# Patient Record
Sex: Female | Born: 1970 | Race: Black or African American | Hispanic: No | Marital: Married | Smoking: Former smoker
Health system: Southern US, Community
[De-identification: ages and names within clinical notes are randomized; demographics above are authoritative.]

## PROBLEM LIST (undated history)

## (undated) DIAGNOSIS — D259 Leiomyoma of uterus, unspecified: Secondary | ICD-10-CM

## (undated) DIAGNOSIS — R51 Headache: Secondary | ICD-10-CM

## (undated) DIAGNOSIS — E860 Dehydration: Secondary | ICD-10-CM

## (undated) DIAGNOSIS — R300 Dysuria: Secondary | ICD-10-CM

## (undated) DIAGNOSIS — I10 Essential (primary) hypertension: Secondary | ICD-10-CM

## (undated) DIAGNOSIS — R030 Elevated blood-pressure reading, without diagnosis of hypertension: Secondary | ICD-10-CM

## (undated) DIAGNOSIS — Z973 Presence of spectacles and contact lenses: Secondary | ICD-10-CM

## (undated) DIAGNOSIS — F411 Generalized anxiety disorder: Secondary | ICD-10-CM

## (undated) DIAGNOSIS — K219 Gastro-esophageal reflux disease without esophagitis: Secondary | ICD-10-CM

## (undated) DIAGNOSIS — A6000 Herpesviral infection of urogenital system, unspecified: Secondary | ICD-10-CM

## (undated) DIAGNOSIS — R002 Palpitations: Secondary | ICD-10-CM

## (undated) DIAGNOSIS — Z972 Presence of dental prosthetic device (complete) (partial): Secondary | ICD-10-CM

## (undated) DIAGNOSIS — K5900 Constipation, unspecified: Secondary | ICD-10-CM

## (undated) DIAGNOSIS — K589 Irritable bowel syndrome without diarrhea: Secondary | ICD-10-CM

## (undated) HISTORY — DX: Headache: R51

## (undated) HISTORY — DX: Dehydration: E86.0

## (undated) HISTORY — DX: Dysuria: R30.0

## (undated) HISTORY — DX: Herpesviral infection of urogenital system, unspecified: A60.00

## (undated) HISTORY — DX: Palpitations: R00.2

## (undated) HISTORY — PX: BREAST LUMPECTOMY: SHX2

## (undated) HISTORY — DX: Irritable bowel syndrome, unspecified: K58.9

## (undated) HISTORY — DX: Gastro-esophageal reflux disease without esophagitis: K21.9

## (undated) HISTORY — DX: Generalized anxiety disorder: F41.1

## (undated) HISTORY — DX: Elevated blood-pressure reading, without diagnosis of hypertension: R03.0

---

## 2000-01-11 ENCOUNTER — Encounter: Payer: Self-pay | Admitting: Family Medicine

## 2000-01-11 ENCOUNTER — Encounter: Admission: RE | Admit: 2000-01-11 | Discharge: 2000-01-11 | Payer: Self-pay | Admitting: Family Medicine

## 2000-08-01 ENCOUNTER — Other Ambulatory Visit: Admission: RE | Admit: 2000-08-01 | Discharge: 2000-08-01 | Payer: Self-pay | Admitting: Obstetrics & Gynecology

## 2001-05-16 HISTORY — PX: TUBAL LIGATION: SHX77

## 2001-06-30 ENCOUNTER — Inpatient Hospital Stay (HOSPITAL_COMMUNITY): Admission: AD | Admit: 2001-06-30 | Discharge: 2001-06-30 | Payer: Self-pay | Admitting: Obstetrics & Gynecology

## 2001-07-28 ENCOUNTER — Inpatient Hospital Stay (HOSPITAL_COMMUNITY): Admission: AD | Admit: 2001-07-28 | Discharge: 2001-07-31 | Payer: Self-pay | Admitting: Obstetrics and Gynecology

## 2001-09-07 ENCOUNTER — Ambulatory Visit (HOSPITAL_COMMUNITY): Admission: RE | Admit: 2001-09-07 | Discharge: 2001-09-07 | Payer: Self-pay | Admitting: Obstetrics and Gynecology

## 2002-05-30 ENCOUNTER — Other Ambulatory Visit: Admission: RE | Admit: 2002-05-30 | Discharge: 2002-05-30 | Payer: Self-pay | Admitting: Obstetrics and Gynecology

## 2002-05-31 ENCOUNTER — Other Ambulatory Visit: Admission: RE | Admit: 2002-05-31 | Discharge: 2002-05-31 | Payer: Self-pay | Admitting: Obstetrics and Gynecology

## 2003-07-07 ENCOUNTER — Other Ambulatory Visit: Admission: RE | Admit: 2003-07-07 | Discharge: 2003-07-07 | Payer: Self-pay | Admitting: Obstetrics & Gynecology

## 2004-07-28 ENCOUNTER — Other Ambulatory Visit: Admission: RE | Admit: 2004-07-28 | Discharge: 2004-07-28 | Payer: Self-pay | Admitting: Obstetrics and Gynecology

## 2005-01-11 ENCOUNTER — Ambulatory Visit: Payer: Self-pay | Admitting: Gastroenterology

## 2005-02-04 ENCOUNTER — Ambulatory Visit: Payer: Self-pay | Admitting: Gastroenterology

## 2005-02-14 ENCOUNTER — Ambulatory Visit: Payer: Self-pay | Admitting: Gastroenterology

## 2007-07-03 ENCOUNTER — Emergency Department (HOSPITAL_COMMUNITY): Admission: EM | Admit: 2007-07-03 | Discharge: 2007-07-03 | Payer: Self-pay | Admitting: Family Medicine

## 2008-05-28 DIAGNOSIS — K5900 Constipation, unspecified: Secondary | ICD-10-CM

## 2008-05-28 DIAGNOSIS — Z862 Personal history of diseases of the blood and blood-forming organs and certain disorders involving the immune mechanism: Secondary | ICD-10-CM

## 2008-05-30 ENCOUNTER — Ambulatory Visit: Payer: Self-pay | Admitting: Gastroenterology

## 2008-05-30 DIAGNOSIS — K589 Irritable bowel syndrome without diarrhea: Secondary | ICD-10-CM

## 2008-05-30 LAB — CONVERTED CEMR LAB
ALT: 11 units/L (ref 0–35)
AST: 16 units/L (ref 0–37)
Albumin: 3.7 g/dL (ref 3.5–5.2)
Alkaline Phosphatase: 63 units/L (ref 39–117)
BUN: 11 mg/dL (ref 6–23)
Basophils Absolute: 0 10*3/uL (ref 0.0–0.1)
Basophils Relative: 0.8 % (ref 0.0–3.0)
CO2: 25 meq/L (ref 19–32)
Calcium: 9 mg/dL (ref 8.4–10.5)
Chloride: 108 meq/L (ref 96–112)
Creatinine, Ser: 0.6 mg/dL (ref 0.4–1.2)
Eosinophils Absolute: 0.1 10*3/uL (ref 0.0–0.7)
Eosinophils Relative: 1.9 % (ref 0.0–5.0)
GFR calc Af Amer: 146 mL/min
GFR calc non Af Amer: 121 mL/min
Glucose, Bld: 85 mg/dL (ref 70–99)
HCT: 32.9 % — ABNORMAL LOW (ref 36.0–46.0)
Hemoglobin: 11.1 g/dL — ABNORMAL LOW (ref 12.0–15.0)
Lymphocytes Relative: 29.8 % (ref 12.0–46.0)
MCHC: 33.7 g/dL (ref 30.0–36.0)
MCV: 86.6 fL (ref 78.0–100.0)
Monocytes Absolute: 0.4 10*3/uL (ref 0.1–1.0)
Monocytes Relative: 7.4 % (ref 3.0–12.0)
Neutro Abs: 3.3 10*3/uL (ref 1.4–7.7)
Neutrophils Relative %: 60.1 % (ref 43.0–77.0)
Platelets: 232 10*3/uL (ref 150–400)
Potassium: 4.2 meq/L (ref 3.5–5.1)
RBC: 3.81 M/uL — ABNORMAL LOW (ref 3.87–5.11)
RDW: 13 % (ref 11.5–14.6)
Sed Rate: 29 mm/hr — ABNORMAL HIGH (ref 0–22)
Sodium: 138 meq/L (ref 135–145)
TSH: 1.69 microintl units/mL (ref 0.35–5.50)
Total Bilirubin: 0.6 mg/dL (ref 0.3–1.2)
Total Protein: 6.9 g/dL (ref 6.0–8.3)
WBC: 5.4 10*3/uL (ref 4.5–10.5)

## 2008-06-27 ENCOUNTER — Ambulatory Visit: Payer: Self-pay | Admitting: Gastroenterology

## 2008-07-01 ENCOUNTER — Ambulatory Visit (HOSPITAL_COMMUNITY): Admission: RE | Admit: 2008-07-01 | Discharge: 2008-07-01 | Payer: Self-pay | Admitting: Gastroenterology

## 2008-09-23 ENCOUNTER — Telehealth: Payer: Self-pay | Admitting: Gastroenterology

## 2008-11-15 ENCOUNTER — Emergency Department (HOSPITAL_COMMUNITY): Admission: EM | Admit: 2008-11-15 | Discharge: 2008-11-15 | Payer: Self-pay | Admitting: Family Medicine

## 2009-02-23 LAB — CONVERTED CEMR LAB

## 2009-05-16 HISTORY — PX: COLONOSCOPY WITH ESOPHAGOGASTRODUODENOSCOPY (EGD): SHX5779

## 2009-06-30 ENCOUNTER — Encounter (INDEPENDENT_AMBULATORY_CARE_PROVIDER_SITE_OTHER): Payer: Self-pay | Admitting: *Deleted

## 2009-06-30 ENCOUNTER — Ambulatory Visit: Payer: Self-pay | Admitting: Gastroenterology

## 2009-06-30 DIAGNOSIS — K5909 Other constipation: Secondary | ICD-10-CM

## 2009-06-30 LAB — CONVERTED CEMR LAB: Tissue Transglutaminase Ab, IgA: 0.3 units (ref <7)

## 2009-07-01 LAB — CONVERTED CEMR LAB
ALT: 11 units/L (ref 0–35)
AST: 14 units/L (ref 0–37)
Albumin: 3.9 g/dL (ref 3.5–5.2)
Alkaline Phosphatase: 63 units/L (ref 39–117)
BUN: 13 mg/dL (ref 6–23)
Basophils Absolute: 0 10*3/uL (ref 0.0–0.1)
Basophils Relative: 0.6 % (ref 0.0–3.0)
Bilirubin, Direct: 0.1 mg/dL (ref 0.0–0.3)
CO2: 25 meq/L (ref 19–32)
Calcium: 8.7 mg/dL (ref 8.4–10.5)
Chloride: 106 meq/L (ref 96–112)
Creatinine, Ser: 0.6 mg/dL (ref 0.4–1.2)
Eosinophils Absolute: 0.1 10*3/uL (ref 0.0–0.7)
Eosinophils Relative: 1.3 % (ref 0.0–5.0)
Ferritin: 3.3 ng/mL — ABNORMAL LOW (ref 10.0–291.0)
Folate: 8.4 ng/mL
GFR calc non Af Amer: 144.7 mL/min (ref >60)
Glucose, Bld: 95 mg/dL (ref 70–99)
HCT: 31.2 % — ABNORMAL LOW (ref 36.0–46.0)
Hemoglobin: 10.2 g/dL — ABNORMAL LOW (ref 12.0–15.0)
IgA: 276 mg/dL (ref 68–378)
Iron: 38 ug/dL — ABNORMAL LOW (ref 42–145)
Lymphocytes Relative: 34.6 % (ref 12.0–46.0)
Lymphs Abs: 1.7 10*3/uL (ref 0.7–4.0)
MCHC: 32.8 g/dL (ref 30.0–36.0)
MCV: 84.1 fL (ref 78.0–100.0)
Monocytes Absolute: 0.4 10*3/uL (ref 0.1–1.0)
Monocytes Relative: 7.7 % (ref 3.0–12.0)
Neutro Abs: 2.6 10*3/uL (ref 1.4–7.7)
Neutrophils Relative %: 55.8 % (ref 43.0–77.0)
Platelets: 282 10*3/uL (ref 150.0–400.0)
Potassium: 3.7 meq/L (ref 3.5–5.1)
RBC: 3.7 M/uL — ABNORMAL LOW (ref 3.87–5.11)
RDW: 14.7 % — ABNORMAL HIGH (ref 11.5–14.6)
Saturation Ratios: 6.7 % — ABNORMAL LOW (ref 20.0–50.0)
Sed Rate: 33 mm/hr — ABNORMAL HIGH (ref 0–22)
Sodium: 138 meq/L (ref 135–145)
TSH: 1.38 microintl units/mL (ref 0.35–5.50)
Total Bilirubin: 0.2 mg/dL — ABNORMAL LOW (ref 0.3–1.2)
Total Protein: 6.9 g/dL (ref 6.0–8.3)
Transferrin: 402.2 mg/dL — ABNORMAL HIGH (ref 212.0–360.0)
Vitamin B-12: 386 pg/mL (ref 211–911)
WBC: 4.8 10*3/uL (ref 4.5–10.5)

## 2009-07-06 ENCOUNTER — Ambulatory Visit: Payer: Self-pay | Admitting: Gastroenterology

## 2009-07-08 ENCOUNTER — Encounter: Payer: Self-pay | Admitting: Gastroenterology

## 2010-01-16 ENCOUNTER — Encounter: Payer: Self-pay | Admitting: Internal Medicine

## 2010-01-16 LAB — CONVERTED CEMR LAB: Hgb A1c MFr Bld: 5.8 %

## 2010-02-04 ENCOUNTER — Ambulatory Visit: Payer: Self-pay | Admitting: Internal Medicine

## 2010-02-04 DIAGNOSIS — Z87891 Personal history of nicotine dependence: Secondary | ICD-10-CM

## 2010-02-04 DIAGNOSIS — R51 Headache: Secondary | ICD-10-CM | POA: Insufficient documentation

## 2010-02-04 DIAGNOSIS — Z9189 Other specified personal risk factors, not elsewhere classified: Secondary | ICD-10-CM | POA: Insufficient documentation

## 2010-02-04 DIAGNOSIS — R358 Other polyuria: Secondary | ICD-10-CM

## 2010-02-04 DIAGNOSIS — R519 Headache, unspecified: Secondary | ICD-10-CM | POA: Insufficient documentation

## 2010-02-04 DIAGNOSIS — A6000 Herpesviral infection of urogenital system, unspecified: Secondary | ICD-10-CM | POA: Insufficient documentation

## 2010-02-05 LAB — CONVERTED CEMR LAB
ALT: 10 units/L (ref 0–35)
AST: 17 units/L (ref 0–37)
Albumin: 3.5 g/dL (ref 3.5–5.2)
Alkaline Phosphatase: 57 units/L (ref 39–117)
BUN: 10 mg/dL (ref 6–23)
Basophils Absolute: 0 10*3/uL (ref 0.0–0.1)
Basophils Relative: 0.8 % (ref 0.0–3.0)
Bilirubin Urine: NEGATIVE
Bilirubin, Direct: 0.1 mg/dL (ref 0.0–0.3)
CO2: 26 meq/L (ref 19–32)
Calcium: 8.8 mg/dL (ref 8.4–10.5)
Chloride: 106 meq/L (ref 96–112)
Cholesterol: 197 mg/dL (ref 0–200)
Creatinine, Ser: 0.7 mg/dL (ref 0.4–1.2)
Eosinophils Absolute: 0.2 10*3/uL (ref 0.0–0.7)
Eosinophils Relative: 4.2 % (ref 0.0–5.0)
GFR calc non Af Amer: 126.98 mL/min (ref >60)
Glucose, Bld: 79 mg/dL (ref 70–99)
HCT: 32.1 % — ABNORMAL LOW (ref 36.0–46.0)
HDL: 78.9 mg/dL (ref >39.00)
Hemoglobin, Urine: NEGATIVE
Hemoglobin: 11.1 g/dL — ABNORMAL LOW (ref 12.0–15.0)
Ketones, ur: NEGATIVE mg/dL
LDL Cholesterol: 109 mg/dL — ABNORMAL HIGH (ref 0–99)
Leukocytes, UA: NEGATIVE
Lymphocytes Relative: 36.6 % (ref 12.0–46.0)
Lymphs Abs: 1.5 10*3/uL (ref 0.7–4.0)
MCHC: 34.6 g/dL (ref 30.0–36.0)
MCV: 85.2 fL (ref 78.0–100.0)
Monocytes Absolute: 0.4 10*3/uL (ref 0.1–1.0)
Monocytes Relative: 9.1 % (ref 3.0–12.0)
Neutro Abs: 2.1 10*3/uL (ref 1.4–7.7)
Neutrophils Relative %: 49.3 % (ref 43.0–77.0)
Nitrite: NEGATIVE
Platelets: 253 10*3/uL (ref 150.0–400.0)
Potassium: 5.3 meq/L — ABNORMAL HIGH (ref 3.5–5.1)
RBC: 3.77 M/uL — ABNORMAL LOW (ref 3.87–5.11)
RDW: 16.1 % — ABNORMAL HIGH (ref 11.5–14.6)
Sodium: 140 meq/L (ref 135–145)
Specific Gravity, Urine: >=1.03 (ref 1.000–1.030)
TSH: 0.76 microintl units/mL (ref 0.35–5.50)
Total Bilirubin: 0.3 mg/dL (ref 0.3–1.2)
Total CHOL/HDL Ratio: 2
Total Protein, Urine: NEGATIVE mg/dL
Total Protein: 6.6 g/dL (ref 6.0–8.3)
Triglycerides: 44 mg/dL (ref 0.0–149.0)
Urine Glucose: NEGATIVE mg/dL
Urobilinogen, UA: 0.2 (ref 0.0–1.0)
VLDL: 8.8 mg/dL (ref 0.0–40.0)
WBC: 4.2 10*3/uL — ABNORMAL LOW (ref 4.5–10.5)
pH: 6 (ref 5.0–8.0)

## 2010-03-05 ENCOUNTER — Telehealth: Payer: Self-pay | Admitting: Internal Medicine

## 2010-03-16 ENCOUNTER — Telehealth: Payer: Self-pay | Admitting: Internal Medicine

## 2010-03-18 ENCOUNTER — Ambulatory Visit: Payer: Self-pay | Admitting: Internal Medicine

## 2010-03-18 DIAGNOSIS — B373 Candidiasis of vulva and vagina: Secondary | ICD-10-CM

## 2010-03-18 DIAGNOSIS — R300 Dysuria: Secondary | ICD-10-CM | POA: Insufficient documentation

## 2010-03-18 LAB — CONVERTED CEMR LAB
Nitrite: NEGATIVE
Total Protein, Urine: NEGATIVE mg/dL
pH: 6 (ref 5.0–8.0)

## 2010-04-16 ENCOUNTER — Encounter: Payer: Self-pay | Admitting: Internal Medicine

## 2010-04-30 ENCOUNTER — Telehealth: Payer: Self-pay | Admitting: Internal Medicine

## 2010-04-30 ENCOUNTER — Ambulatory Visit: Payer: Self-pay | Admitting: Internal Medicine

## 2010-06-17 NOTE — Assessment & Plan Note (Signed)
Summary: NEW [employer] PT--#--PKG--STC   Vital Signs:  Patient profile:   40 year old female Height:      67 inches (170.18 cm) Weight:      138.0 pounds (62.73 kg) O2 Sat:      99 % on Room air Temp:     98.5 degrees F (36.94 degrees C) oral Pulse rate:   88 / minute BP sitting:   98 / 76  (left arm) Cuff size:   regular  Vitals Entered By: Orlan Leavens RMA (February 04, 2010 8:24 AM)  O2 Flow:  Room air CC: New patient Is Patient Diabetic? No Pain Assessment Patient in pain? no        Primary Care Provider:  Newt Lukes MD  CC:  New patient.  History of Present Illness: new pt to me and our division, here to est care also, patient is here today for annual physical. Patient feels generally well today.   reviewed other issues:  1) preDM - recent labs done 01/14/10 suggest inc risk for DM (a1c 5.8) - advised to watch carbs in diet  2) constipation, chronic, has been eval by GI for same early 2011  3) c/o polyuria - a/w mild dysuria - onset 6 weeks ago -course is intermittent - no hematuria or fever - improved s/p 5 d septra course but now symptoms relapsed  Preventive Screening-Counseling & Management  Alcohol-Tobacco     Alcohol drinks/day: <1     Alcohol Counseling: not indicated; use of alcohol is not excessive or problematic     Smoking Status: current     Packs/Day: <0.25     Tobacco Counseling: to quit use of tobacco products  Caffeine-Diet-Exercise     Does Patient Exercise: no     Exercise Counseling: to improve exercise regimen     Depression Counseling: not indicated; screening negative for depression  Safety-Violence-Falls     Seat Belt Counseling: not indicated; patient wears seat belts     Helmet Counseling: not applicable     Firearm Counseling: not applicable     Violence Counseling: not indicated; no violence risk noted     Fall Risk Counseling: not indicated; no significant falls noted  Clinical Review Panels:  Prevention   Last Pap  Smear:  Interpretation Result:Negative for intraepithelial Lesion or Malignancy.    (02/23/2009)   Last Colonoscopy:  DONE (07/06/2009)  Diabetes Management   HgBA1C:  5.8 (01/16/2010)   Creatinine:  0.6 (06/30/2009)  CBC   WBC:  4.8 (06/30/2009)   RBC:  3.70 (06/30/2009)   Hgb:  10.2 (06/30/2009)   Hct:  31.2 (06/30/2009)   Platelets:  282.0 (06/30/2009)   MCV  84.1 (06/30/2009)   MCHC  32.8 (06/30/2009)   RDW  14.7 (06/30/2009)   PMN:  55.8 (06/30/2009)   Lymphs:  34.6 (06/30/2009)   Monos:  7.7 (06/30/2009)   Eosinophils:  1.3 (06/30/2009)   Basophil:  0.6 (06/30/2009)  Complete Metabolic Panel   Glucose:  95 (06/30/2009)   Sodium:  138 (06/30/2009)   Potassium:  3.7 (06/30/2009)   Chloride:  106 (06/30/2009)   CO2:  25 (06/30/2009)   BUN:  13 (06/30/2009)   Creatinine:  0.6 (06/30/2009)   Albumin:  3.9 (06/30/2009)   Total Protein:  6.9 (06/30/2009)   Calcium:  8.7 (06/30/2009)   Total Bili:  0.2 (06/30/2009)   Alk Phos:  63 (06/30/2009)   SGPT (ALT):  11 (06/30/2009)   SGOT (AST):  14 (06/30/2009)   -  Date:  01/16/2010    HgbA1c: 5.8  Current Medications (verified): 1)  Yaz 3-0.02 Mg Tabs (Drospirenone-Ethinyl Estradiol) .... One Tablet By Mouth Once Daily 2)  Multivitamins   Tabs (Multiple Vitamin) .... One Tablet By Mouth Once Daily 3)  Tandem 162-115.2 Mg Caps (Ferrous Fum-Iron Polysacch) .Marland Kitchen.. 1 By Mouth Qd 4)  Digestive Support  Caps (Digestive Enzymes) .... Take 2 By Mouth Once Daily 5)  Regularity (Ovc) .... Take 2 By Mouth Once Daily  Allergies (verified): 1)  ! Penicillin  Past History:  Past Medical History: IBS  ANEMIA, HX  CONSTIPATION  MD roster: GI - [facility] - michelle bicklman ([facility])    Past Surgical History: Tubal Ligation (2003) Breast surgery-lump removal (left)   Family History: No FH of Colon Cancer Family History of Irritable Bowel Syndrome: Father ? Hx stroke (parent)  Social History: Married, lives with  spouse and Alcohol Use - yes-1 glass at bedtime  Illicit Drug Use - no Patient is an occ smoker-"only when stressed" Patient does not get regular exercise.  employed asbankrupcy specialist Smoking Status:  current Packs/Day:  <0.25  Review of Systems       see HPI above. I have reviewed all other systems and they were negative.   Physical Exam  General:  alert, well-developed, well-nourished, and cooperative to examination.    Head:  Normocephalic and atraumatic without obvious abnormalities. No apparent alopecia or balding. Eyes:  vision grossly intact; pupils equal, round and reactive to light.  conjunctiva and lids normal.    Ears:  normal pinnae bilaterally, without erythema, swelling, or tenderness to palpation. TMs clear, without effusion, or cerumen impaction. Hearing grossly normal bilaterally  Mouth:  teeth and gums in good repair; mucous membranes moist, without lesions or ulcers. oropharynx clear without exudate, no erythema.  Neck:  supple, full ROM, no masses, no thyromegaly; no thyroid nodules or tenderness. no JVD or carotid bruits.   Lungs:  normal respiratory effort, no intercostal retractions or use of accessory muscles; normal breath sounds bilaterally - no crackles and no wheezes.    Heart:  normal rate, regular rhythm, no murmur, and no rub. BLE without edema. normal DP pulses and normal cap refill in all 4 extremities    Abdomen:  soft, non-tender, normal bowel sounds, no distention; no masses and no appreciable hepatomegaly or splenomegaly.   Genitalia:  defer gyn Msk:  No deformity or scoliosis noted of thoracic or lumbar spine.   Neurologic:  alert & oriented X3 and cranial nerves II-XII symetrically intact.  strength normal in all extremities, sensation intact to light touch, and gait normal. speech fluent without dysarthria or aphasia; follows commands with good comprehension.  Skin:  no rashes, vesicles, ulcers, or erythema. No nodules or irregularity to  palpation.  Psych:  Oriented X3, memory intact for recent and remote, normally interactive, good eye contact, not anxious appearing, not depressed appearing, and not agitated.      Impression & Recommendations:  Problem # 1:  PREVENTIVE HEALTH CARE (ICD-V70.0) Patient has been counseled on age-appropriate routine health concerns for screening and prevention. These are reviewed and up-to-date. Immunizations are up-to-date or declined. Labs ordered and will be reviewed.  Orders: TLB-Lipid Panel (80061-LIPID) TLB-BMP (Basic Metabolic Panel-BMET) (80048-METABOL) TLB-CBC Platelet - w/Differential (85025-CBCD) TLB-Hepatic/Liver Function Pnl (80076-HEPATIC) TLB-TSH (Thyroid Stimulating Hormone) (84443-TSH) TLB-Udip w/ Micro (81001-URINE)  Problem # 2:  POLYURIA (JWJ-191.47) classic UTI symptoms - tx abx and pyridium - erx done - call if recurrent problems Orders:  Prescription Created Electronically [phone])  Complete Medication List: 1)  Yaz 3-0.02 Mg Tabs (Drospirenone-ethinyl estradiol) .... One tablet by mouth once daily 2)  Multivitamins Tabs (Multiple vitamin) .... One tablet by mouth once daily 3)  Tandem 162-115.2 Mg Caps (Ferrous fum-iron polysacch) .Marland Kitchen.. 1 by mouth qd 4)  Digestive Support Caps (Digestive enzymes) .... Take 2 by mouth once daily 5)  Regularity (ovc)  .... Take 2 by mouth once daily 6)  Ciprofloxacin Hcl 500 Mg Tabs (Ciprofloxacin hcl) .Marland Kitchen.. 1 by mouth two times a day x 5 days 7)  Pyridium 100 Mg Tabs (Phenazopyridine hcl) .Marland Kitchen.. 1 by mouth three times a day x 3 days  Patient Instructions: 1)  it was good to see you today. 2)  test(s) ordered today - your results will be posted on the phone tree for review in 48-72 hours from the time of test completion; call [phone] and enter your 9 digit MRN (listed above on this page, just below your name); if any changes need to be made or there are abnormal results, you will be contacted directly. 3)  it is important that you  increase your physical activity level - start by walking for 10-20 minutes 3 times per week and work up to 30 minutes 4-5 times each week.  4)  cipro + pyridium for bladder symptoms - your prescriptions have been electronically submitted to your pharmacy. Please take as directed. Contact our office if you believe you're having problems with the medication(s).  5)  continue regularity or any other probiotic (phillips colon health, align, florastor, etc) for bowels once daily  6)  Please schedule a follow-up appointment as needed. Prescriptions: PYRIDIUM 100 MG TABS (PHENAZOPYRIDINE HCL) 1 by mouth three times a day x 3 days  #9 x 0   Entered and Authorized by:   Newt Lukes MD   Signed by:   Newt Lukes MD on 02/04/2010   Method used:   Electronically to        [facility] [address] [phone]* (retail)       [address]       [place], [place]  [address]       Ph: [phone] or [phone]       Fax: [phone]   RxID:   [id number] CIPROFLOXACIN HCL 500 MG TABS (CIPROFLOXACIN HCL) 1 by mouth two times a day x 5 days  #10 x 0   Entered and Authorized by:   Newt Lukes MD   Signed by:   Newt Lukes MD on 02/04/2010   Method used:   Electronically to        [facility] [address] [phone]* (retail)       [address]       [place], [place]  [address]       Ph: [phone] or [phone]       Fax: [phone]   RxID:   [id number]     Pap Smear  Procedure date:  02/23/2009  Findings:      Interpretation Result:Negative for intraepithelial Lesion or Malignancy.     Colonoscopy  Procedure date:  07/06/2009  Findings:      [facility]  Results: Normal.

## 2010-06-17 NOTE — Consult Note (Signed)
Summary: [facility]  [facility]   Imported By: Lennie Odor 04/21/2010 15:38:15  _____________________________________________________________________  External Attachment:    Type:   Image     Comment:   External Document

## 2010-06-17 NOTE — Progress Notes (Signed)
  Phone Note Call from Patient   Caller: Patient Call For: Newt Lukes MD Summary of Call: Patient called today and left msg. on triage. She is still having urinating problems. She would like a call back. Call back numbers are [phone] or [phone]. Initial call taken by: Robin Ewing CMA Duncan Dull),  March 16, 2010 4:02 PM  Follow-up for Phone Call        called pt. and pt did try AZO for urinary symptoms, but did not help and informed would need to schedule OV with Dr. Felicity Coyer. Patient agreed to do so and scheduled OV. Follow-up by: Zella Ball Ewing CMA (AAMA),  March 16, 2010 4:20 PM

## 2010-06-17 NOTE — Assessment & Plan Note (Signed)
Summary: FOLLOW UP/YF   History of Present Illness Visit Type: Follow-up Visit Primary GI MD: Sheryn Bison MD FACP FAGA Primary Provider: n/a Chief Complaint: Patient here for f/u of constipation. Patient states that her constipation is no better. She stopped taking the Amitiza given to her because it made her naseous and upset her stomach. She has had increased bloating and gas. She also notes occasional brbpr. She denies any abdominal pain etc. History of Present Illness:   40 year old African American female with chronic constipation confirm Sitz marker study several years ago showing her markers to all be in the sigmoid colon in 5 days. She continues to be constipated but has rather marked noncompliance. 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 She does have some crampy abdominal pain and distention with her constipation but denies rectal bleeding. She has had mild anorexia and weight loss. Colonoscopy was performed several years ago and was otherwise unremarkable. She is followed by her gynecologist and denies any chronic medical problems or history of diabetes or thyroid dysfunction. She currently is on birth control pills and is having normal menstrual periods.  She denies associated dysphasia, bladder emptying problems, or other neuromuscular or neurological problems. She does not drink liberal p.o. fluids. She denies systemic complaints such as fever, chills, skin rashes, joint pains, oral stomatitis etc. Her appetite is slightly diminished since she's had 5-10 pound weight loss over the last 2 years. Family history is noncontributory.   GI Review of Systems    Reports acid reflux, bloating, and  heartburn.      Denies abdominal pain, belching, chest pain, dysphagia with liquids, dysphagia with solids, loss of appetite, nausea, vomiting, vomiting blood,  weight loss, and  weight gain.      Reports constipation, hemorrhoids, irritable bowel syndrome, and  rectal bleeding.     Denies anal fissure, black tarry stools, change in bowel habit, diarrhea, diverticulosis, fecal incontinence, heme positive stool, jaundice, light color stool, liver problems, and  rectal pain. Preventive Screening-Counseling & Management  Caffeine-Diet-Exercise     Does Patient Exercise: no    Current Medications (verified): 1)  Yaz 3-0.02 Mg Tabs (Drospirenone-Ethinyl Estradiol) .... One Tablet By Mouth Once Daily 2)  Multivitamins   Tabs (Multiple Vitamin) .... One Tablet By Mouth Once Daily 3)  Miralax  Powd (Polyethylene Glycol 3350) .... Take 1 Scoop Dissolved in Water Every Weekend 4)  Benefiber  Powd (Wheat Dextrin) .... Take 1 Heaping Teaspoon Dissolved in Juice Every Other Day  Allergies (verified): 1)  ! Penicillin  Past History:  Past medical, surgical, family and social histories (including risk factors) reviewed for relevance to current acute and chronic problems.  Past Medical History: Reviewed history from 05/30/2008 and no changes required. Current Problems:  IBS (ICD-564.1) ANEMIA, HX OF (ICD-V12.3) CONSTIPATION (ICD-564.00)    Past Surgical History: Reviewed history from 06/27/2008 and no changes required. Tubal Ligation Breast surgery-lump removal (left)  Family History: Reviewed history from 06/27/2008 and no changes required. No FH of Colon Cancer: Family History of Irritable Bowel Syndrome: Father ?  Social History: Reviewed history from 06/27/2008 and no changes required. Married Alcohol Use - yes-1 glass at bedtime  Illicit Drug Use - no Patient is a former smoker-stopped  Patient does not get regular exercise.  Does Patient Exercise:  no  Review of Systems       The  patient complains of back pain, fatigue, and sleeping problems.  The patient denies allergy/sinus, anemia, anxiety-new, arthritis/joint pain, blood in  urine, breast changes/lumps, change in vision, confusion, cough, coughing up blood, depression-new, fainting, fever, headaches-new, hearing problems, heart murmur, heart rhythm changes, itching, menstrual pain, muscle pains/cramps, night sweats, nosebleeds, pregnancy symptoms, shortness of breath, skin rash, sore throat, swelling of feet/legs, swollen lymph glands, thirst - excessive , urination - excessive , urination changes/pain, urine leakage, vision changes, and voice change.    Vital Signs:  Patient profile:   40 year old female Height:      67 inches Weight:      135.38 pounds BMI:     21.28 BSA:     1.71 Pulse rate:   92 / minute Pulse rhythm:   regular BP sitting:   94 / 58  (right arm)  Vitals Entered By: Hortense Ramal CMA Duncan Dull) (June 30, 2009 4:30 PM)  Physical Exam  General:  Well developed, well nourished, no acute distress.healthy appearing.   Head:  Normocephalic and atraumatic. Eyes:  PERRLA, no icterus. Lungs:  Clear throughout to auscultation. Heart:  Regular rate and rhythm; no murmurs, rubs,  or bruits. Abdomen:  There is mild distention present but active normal bowel sounds. I cannot appreciate abdominal masses or organomegaly. There are no areas of tenderness noted. Rectal:  deferred until time of colonoscopy.   Msk:  Symmetrical with no gross deformities. Normal posture. Pulses:  Normal pulses noted. Extremities:  No clubbing, cyanosis, edema or deformities noted. Neurologic:  Alert and  oriented x4;  grossly normal neurologically. Cervical Nodes:  No significant cervical adenopathy. Inguinal Nodes:  No significant inguinal adenopathy. Psych:  Alert and cooperative. Normal mood and affect.   Impression & Recommendations:  Problem # 1:  OTHER CONSTIPATION (ICD-564.09) Assessment Unchanged Chronic functional constipation with rectosigmoid dysfunction that should respond to regular constipation regimes. I placed her on daily Benefiber, a liberal p.o.  fluids, and MiraLax every 2-3 nights with p.r.n. Dulcolax suppositories. Colonoscopy has been scheduled at her convenience and also laboratory data has been ordered to exclude metabolic disorders, anemia, or systemic illness. Orders: TLB-CBC Platelet - w/Differential (85025-CBCD) TLB-BMP (Basic Metabolic Panel-BMET) (80048-METABOL) TLB-Hepatic/Liver Function Pnl (80076-HEPATIC) TLB-TSH (Thyroid Stimulating Hormone) (84443-TSH) TLB-B12, Serum-Total ONLY (16109-U04) TLB-Ferritin (82728-FER) TLB-Folic Acid (Folate) (82746-FOL) TLB-IBC Pnl (Iron/FE;Transferrin) (83550-IBC) TLB-Sedimentation Rate (ESR) (85652-ESR) T-Beta Carotene [phone]) T-Sprue Panel (Celiac Disease Aby Eval) (83516x3/[address]) TLB-IgA (Immunoglobulin A) (82784-IGA) Colonoscopy (Colon)  Problem # 2:  ANEMIA, HX OF (ICD-V12.3) Assessment: Unchanged Anemia Profile Ordered.  Patient Instructions: 1)  High Fiber, Low Fat  Healthy Eating Plan brochure given.  2)  Constipation and Hemorrhoids brochure given.  3)  Colonoscopy and Flexible Sigmoidoscopy brochure given.  4)  Conscious Sedation brochure given.  5)  Bowel Regimen for Chronic Constipation handout given.  6)  labs pending Prescriptions: DULCOLAX 10 MG  SUPP (BISACODYL) 1 per rectum q 2-3 days as needed  #14 x 3   Entered by:   Ashok Cordia RN   Authorized by:   Mardella Layman MD [facility]   Signed by:   Ashok Cordia RN on 06/30/2009   Method used:   Electronically to        [facility] [address] [phone]* (retail)       [address]       [place], [place]  [address]       Ph: [phone] or [phone]  Fax: [phone]   RxID:   [id number] MOVIPREP 100 GM  SOLR (PEG-KCL-NACL-NASULF-NA ASC-C) As per prep instructions.  #1 x 0   Entered by:   Ashok Cordia RN   Authorized by:   Mardella Layman MD [facility]   Signed by:   Ashok Cordia RN on 06/30/2009   Method used:   Electronically to        [facility]. [address] [phone]*  (retail)       [address]       [place], [place]  [address]       Ph: [phone] or [phone]       Fax: [phone]   RxID:   [id number]

## 2010-06-17 NOTE — Progress Notes (Signed)
Summary: uro second opinion  Phone Note Call from Patient Call back at Work Phone [phone]   Caller: Patient Call For: Newt Lukes MD Summary of Call: Pt came in the office and was wondering if Dr. Felicity Coyer can refer her to different urologist becasue she is unsatisfied with the one that she just went to. Please advise.  Initial call taken by: Livingston Diones,  April 30, 2010 9:28 AM  Follow-up for Phone Call        if pt willing to go to different city, i will happily refer to [facility] (or [facility] or [facility] if pr prefers one of these); unfortunately, only one uro group in [place], so no other "local" opinion available [facility] - let me know - thanks Follow-up by: Newt Lukes MD,  April 30, 2010 1:07 PM  Additional Follow-up for Phone Call Additional follow up Details #1::        Pt advised and has decided to go to [facility]. Pt also will check with [facility]. Additional Follow-up by: Margaret Pyle, CMA,  April 30, 2010 1:39 PM    Additional Follow-up for Phone Call Additional follow up Details #2::    referral order done - [facility] will call once arranged - thanks Newt Lukes MD  April 30, 2010 4:48 PM

## 2010-06-17 NOTE — Assessment & Plan Note (Signed)
Summary: f/u appt/cd   Vital Signs:  Patient profile:   40 year old female Height:      67 inches (170.18 cm) Weight:      139.0 pounds (63.18 kg) O2 Sat:      99 % on Room air Temp:     99.1 degrees F (37.28 degrees C) oral Pulse rate:   81 / minute BP sitting:   92 / 62  (left arm) Cuff size:   regular  Vitals Entered By: Orlan Leavens RMA (March 18, 2010 9:07 AM)  O2 Flow:  Room air CC: 2 month follow-up Is Patient Diabetic? No Pain Assessment Patient in pain? no        Primary Care Magdiel Bartles:  Newt Lukes MD  CC:  2 month follow-up.  History of Present Illness: here for continued polyuria -  a/w mild dysuria - onset summer 2011  small vol but freq need to void (hourly) course initially intermittent but almost constant now -  no hematuria or fever - s/p with abx x 3 (septra and cipro) - no relief with last abx or with AZO -  reports freq washing due to "unclean" feeling -  now a/w itch, ?yeast -  also concerned about poss STD and wants check for same symptoms all much worse with stress  c/o cont constipation, chronic has been eval by GI for same early 2011-  using probiotic but symptoms worse with stress   Clinical Review Panels:  Lipid Management   Cholesterol:  197 (02/04/2010)   LDL (bad choesterol):  109 (02/04/2010)   HDL (good cholesterol):  78.90 (02/04/2010)  CBC   WBC:  4.2 (02/04/2010)   RBC:  3.77 (02/04/2010)   Hgb:  11.1 (02/04/2010)   Hct:  32.1 (02/04/2010)   Platelets:  253.0 (02/04/2010)   MCV  85.2 (02/04/2010)   MCHC  34.6 (02/04/2010)   RDW  16.1 (02/04/2010)   PMN:  49.3 (02/04/2010)   Lymphs:  36.6 (02/04/2010)   Monos:  9.1 (02/04/2010)   Eosinophils:  4.2 (02/04/2010)   Basophil:  0.8 (02/04/2010)  Complete Metabolic Panel   Glucose:  79 (02/04/2010)   Sodium:  140 (02/04/2010)   Potassium:  5.3 (02/04/2010)   Chloride:  106 (02/04/2010)   CO2:  26 (02/04/2010)   BUN:  10 (02/04/2010)   Creatinine:  0.7  (02/04/2010)   Albumin:  3.5 (02/04/2010)   Total Protein:  6.6 (02/04/2010)   Calcium:  8.8 (02/04/2010)   Total Bili:  0.3 (02/04/2010)   Alk Phos:  57 (02/04/2010)   SGPT (ALT):  10 (02/04/2010)   SGOT (AST):  17 (02/04/2010)   Current Medications (verified): 1)  Yaz 3-0.02 Mg Tabs (Drospirenone-Ethinyl Estradiol) .... One Tablet By Mouth Once Daily 2)  Multivitamins   Tabs (Multiple Vitamin) .... One Tablet By Mouth Once Daily 3)  Digestive Support  Caps (Digestive Enzymes) .... Take 2 By Mouth Once Daily 4)  Regularity (Ovc) .... Take 2 By Mouth Once Daily  Allergies (verified): 1)  ! Penicillin  Past History:  Past Medical History: IBS  ANEMIA, HX  CONSTIPATION  MD roster: GI - patterson  gyn - michelle bicklman ([facility])    Review of Systems  The patient denies abdominal pain, incontinence, genital sores, suspicious skin lesions, and enlarged lymph nodes.    Physical Exam  General:  alert, well-developed, well-nourished, and cooperative to examination.    Lungs:  normal respiratory effort, no intercostal retractions or use of accessory muscles;  normal breath sounds bilaterally - no crackles and no wheezes.    Heart:  normal rate, regular rhythm, no murmur, and no rub. BLE without edema. normal DP pulses and normal cap refill in all 4 extremities    Abdomen:  soft, non-tender, normal bowel sounds, no distention; no masses and no appreciable hepatomegaly or splenomegaly.   Genitalia:  defer gyn   Impression & Recommendations:  Problem # 1:  DYSURIA, CHRONIC (ICD-788.1) concern hx for interstial cystitis -  check labs now r/o infx and tx for candida (given hx) rec against douching and "overcleaning" refer to uro for further eval and tx same Orders: T-Culture, Urine ([id number]) T-GC Probe, urine ([id number]) T-Chlamydia  Probe, urine ([id number]) Urology Referral (Urology) Prescription Created Electronically [phone]) TLB-Udip w/ Micro  (81001-URINE)  Problem # 2:  VAGINITIS, CANDIDAL (ICD-112.1)  Her updated medication list for this problem includes:    Fluconazole 100 Mg Tabs (Fluconazole) .Marland Kitchen... 1 by mouth once daily x 5 days for yeast  Orders: Urology Referral (Urology) Prescription Created Electronically [phone])  Discussed treatment regimen and preventive measures.   Complete Medication List: 1)  Yaz 3-0.02 Mg Tabs (Drospirenone-ethinyl estradiol) .... One tablet by mouth once daily 2)  Multivitamins Tabs (Multiple vitamin) .... One tablet by mouth once daily 3)  Digestive Support Caps (Digestive enzymes) .... Take 2 by mouth once daily 4)  Regularity (ovc)  .... Take 2 by mouth once daily 5)  Fluconazole 100 Mg Tabs (Fluconazole) .Marland Kitchen.. 1 by mouth once daily x 5 days for yeast  Patient Instructions: 1)  it was good to see you today. 2)  test(s) ordered today - your results will be posted on the phone tree for review in 48-72 hours from the time of test completion; call [phone] and enter your 9 digit MRN (listed above on this page, just below your name); if any changes need to be made or there are abnormal results, you will be contacted directly.  3)  use Diflucan once daily x 5 days for yeast as discussed - no other creams for now - your prescription has been electronically submitted to your pharmacy. Please take as directed. Contact our office if you believe you're having problems with the medication(s).  4)  limit washing to 1-2x/day 5)  we'll make referral to urology. Our office will contact you regarding this appointment once made.  Prescriptions: FLUCONAZOLE 100 MG TABS (FLUCONAZOLE) 1 by mouth once daily x 5 days for yeast  #5 x 0   Entered and Authorized by:   Newt Lukes MD   Signed by:   Newt Lukes MD on 03/18/2010   Method used:   Electronically to        [facility] [address] [phone]* (retail)       [address]       [place], [place]  [address]       Ph:  [phone] or [phone]       Fax: [phone]   RxID:   [id number]    Orders Added: 1)  T-Culture, Urine [[id number]] 2)  T-GC Probe, urine [phone] 3)  T-Chlamydia  Probe, urine [phone] 4)  Est. Patient Level IV [46270] 5)  Urology Referral [Urology] 6)  Prescription Created Electronically [[id number]] 7)  TLB-Udip w/ Micro [81001-URINE]

## 2010-06-17 NOTE — Progress Notes (Signed)
Summary: Urinary frequency  Phone Note Call from Patient   Caller: Patient Summary of Call: pt left vm staing she is still having frequent urination. Does she need a Rf on med she was given or OV? Please advise Initial call taken by: Lanier Prude, [facility]),  March 05, 2010 2:20 PM  Follow-up for Phone Call        given recurrence x 2 despite septra then cipro, we need OV to check Ua and Ucx - thanks Follow-up by: Newt Lukes MD,  March 05, 2010 5:16 PM  Additional Follow-up for Phone Call Additional follow up Details #1::        pt informed. She states she was at her GYN on wed and they checked her urine and told her she has no infection.  She says they informed her to try AZO.  Does she still need to come in for OV? Additional Follow-up by: Lanier Prude, [facility]),  March 05, 2010 5:30 PM    Additional Follow-up for Phone Call Additional follow up Details #2::    no need for OV if symptoms resolve with AZO - thanks Newt Lukes MD  March 05, 2010 6:00 PM

## 2010-06-17 NOTE — Letter (Signed)
Summary: [facility] Instructions  [facility]  [address] [place], [place] [address]   Phone: [phone]  Fax: [phone]       Joyce [name]    [birth year]    MRN: [id number]        Procedure Day /Date: Monday, 07/06/09     Arrival Time: 1:00      Procedure Time: 2:00     Location of Procedure:                    Juliann Pares  [facility] (4th Floor)                         PREPARATION FOR COLONOSCOPY WITH MOVIPREP   Starting 5 days prior to your procedure 07/01/09 do not eat nuts, seeds, popcorn, corn, beans, peas,  salads, or any raw vegetables.  Do not take any fiber supplements (e.g. Metamucil, Citrucel, and Benefiber).  THE DAY BEFORE YOUR PROCEDURE         DATE: 07/05/09  DAY: Sunday  1.  Drink clear liquids the entire day-NO SOLID FOOD  2.  Do not drink anything colored red or purple.  Avoid juices with pulp.  No orange juice.  3.  Drink at least 64 oz. (8 glasses) of fluid/clear liquids during the day to prevent dehydration and help the prep work efficiently.  CLEAR LIQUIDS INCLUDE: Water Jello Ice Popsicles Tea (sugar ok, no milk/cream) Powdered fruit flavored drinks Coffee (sugar ok, no milk/cream) Gatorade Juice: apple, white grape, white cranberry  Lemonade Clear bullion, consomm, broth Carbonated beverages (any kind) Strained chicken noodle soup Hard Candy                             4.  In the morning, mix first dose of MoviPrep solution:    Empty 1 Pouch A and 1 Pouch B into the disposable container    Add lukewarm drinking water to the top line of the container. Mix to dissolve    Refrigerate (mixed solution should be used within 24 hrs)  5.  Begin drinking the prep at 5:00 p.m. The MoviPrep container is divided by 4 marks.   Every 15 minutes drink the solution down to the next mark (approximately 8 oz) until the full liter is complete.   6.  Follow completed prep with 16 oz of clear liquid of your choice (Nothing red or  purple).  Continue to drink clear liquids until bedtime.  7.  Before going to bed, mix second dose of MoviPrep solution:    Empty 1 Pouch A and 1 Pouch B into the disposable container    Add lukewarm drinking water to the top line of the container. Mix to dissolve    Refrigerate  THE DAY OF YOUR PROCEDURE      DATE: 07/06/09  DAY: Sunday  Beginning at 9:00a.m. (5 hours before procedure):         1. Every 15 minutes, drink the solution down to the next mark (approx 8 oz) until the full liter is complete.  2. Follow completed prep with 16 oz. of clear liquid of your choice.    3. You may drink clear liquids until 12:00  (2 HOURS BEFORE PROCEDURE).   MEDICATION INSTRUCTIONS  Unless otherwise instructed, you should take regular prescription medications with a small sip of water   as early as possible the morning of  your procedure.          OTHER INSTRUCTIONS  You will need a responsible adult at least 39 years of age to accompany you and drive you home.   This person must remain in the waiting room during your procedure.  Wear loose fitting clothing that is easily removed.  Leave jewelry and other valuables at home.  However, you may wish to bring a book to read or  an iPod/MP3 player to listen to music as you wait for your procedure to start.  Remove all body piercing jewelry and leave at home.  Total time from sign-in until discharge is approximately 2-3 hours.  You should go home directly after your procedure and rest.  You can resume normal activities the  day after your procedure.  The day of your procedure you should not:   Drive   Make legal decisions   Operate machinery   Drink alcohol   Return to work  You will receive specific instructions about eating, activities and medications before you leave.    The above instructions have been reviewed and explained to me by   _______________________    I fully understand and can verbalize these  instructions _____________________________ Date _________

## 2010-06-17 NOTE — Procedures (Signed)
Summary: Colonoscopy  Patient: Joyce [name] Note: All result statuses are Final unless otherwise noted.  Tests: (1) Colonoscopy (COL)   COL Colonoscopy           DONE     [facility]     [address]     [place], [place]  [address]           COLONOSCOPY PROCEDURE REPORT           PATIENT:  Joyce, [name]  MR#:  [id number]     BIRTHDATE:  1971-03-15, 38 yrs. old  GENDER:  female           ENDOSCOPIST:  Vania Rea. Jarold Motto, MD, [facility]     Referred by:           PROCEDURE DATE:  07/06/2009     PROCEDURE:  Colonoscopy with biopsy     ASA CLASS:  Class I     INDICATIONS:  Iron Deficiency Anemia, constipation FAILURE TO     RESPOND TO MEDICAL RX.           MEDICATIONS:   Fentanyl 75 mcg IV, Versed 9 mg           DESCRIPTION OF PROCEDURE:   After the risks benefits and     alternatives of the procedure were thoroughly explained, informed     consent was obtained.  Digital rectal exam was performed and     revealed no abnormalities.   The LB [id number] endoscope     was introduced through the anus and advanced to the cecum, which     was identified by both the appendix and ileocecal valve, without     limitations.  The quality of the prep was excellent, using     MoviPrep.  The instrument was then slowly withdrawn as the colon     was fully examined.     <<PROCEDUREIMAGES>>           FINDINGS:  No polyps or cancers were seen.  Melanosis coli was     found. BIOPSY DONE.   Retroflexed views in the rectum revealed     hypertrophied anal papillae.    The scope was then withdrawn from     the patient and the procedure completed.           COMPLICATIONS:  None           ENDOSCOPIC IMPRESSION:     1) No polyps or cancers     2) Melanosis     3) Hypertrophied anal papillae     RECOMMENDATIONS:     1) Await pathology results     2) Titrate to need     3) High fiber diet with liberal fluid intake.     4) OP follow-up is advised on a PRN basis.           REPEAT EXAM:   No           ______________________________     Vania Rea. Jarold Motto, MD, [facility]           CC:           n.     eSIGNED:   Vania Rea. Patterson at 07/06/2009 02:30 PM           Haywood Pao, [phone]  Note: An exclamation mark (!) indicates a result that was not dispersed into the flowsheet. Document Creation Date: 07/06/2009 2:30 PM _______________________________________________________________________  (1) Order result status: Final Collection or  observation date-time: 07/06/2009 14:24 Requested date-time:  Receipt date-time:  Reported date-time:  Referring Physician:   Ordering Physician: Sheryn Bison [phone]) Specimen Source:  Source: Launa Grill Order Number: [id number] Lab site:

## 2010-06-17 NOTE — Letter (Signed)
Summary: Patient Notice- Colon Biospy Results  [facility]  [address] [place], [place] [address]   Phone: [phone]  Fax: [phone]        July 08, 2009 MRN: [id number]    [facility] [address] [place], [place]  [address]    Dear Ms. Shankle,  I am pleased to inform you that the biopsies taken during your recent colonoscopy did not show any evidence of cancer upon pathologic examination.  Additional information/recommendations:  __No further action is needed at this time.  Please follow-up with      your primary care physician for your other healthcare needs.  __Please call [phone] to schedule a return visit to review      your condition.  x__Continue with the treatment plan as outlined on the day of your      exam.  __You should have a repeat colonoscopy examination for this problem           in _ years.  Please call us if you are having persistent problems or have questions about your condition that have not been fully answered at this time.  Sincerely,  Mardella Layman MD [facility]   This letter has been electronically signed by your physician.  Appended Document: Patient Notice- Colon Biospy Results  Letter mailed 2.24.11

## 2010-06-24 ENCOUNTER — Encounter: Payer: Self-pay | Admitting: Internal Medicine

## 2010-07-02 ENCOUNTER — Ambulatory Visit: Payer: Self-pay | Admitting: Internal Medicine

## 2010-07-07 NOTE — Consult Note (Signed)
Summary: Urology/[facility]   Imported By: Sherian Rein 07/02/2010 12:26:44  _____________________________________________________________________  External Attachment:    Type:   Image     Comment:   External Document

## 2010-07-23 ENCOUNTER — Other Ambulatory Visit: Payer: Managed Care, Other (non HMO)

## 2010-07-23 ENCOUNTER — Encounter: Payer: Self-pay | Admitting: Internal Medicine

## 2010-07-23 ENCOUNTER — Ambulatory Visit (INDEPENDENT_AMBULATORY_CARE_PROVIDER_SITE_OTHER): Payer: Managed Care, Other (non HMO) | Admitting: Internal Medicine

## 2010-07-23 ENCOUNTER — Other Ambulatory Visit: Payer: Self-pay | Admitting: Internal Medicine

## 2010-07-23 DIAGNOSIS — Z0000 Encounter for general adult medical examination without abnormal findings: Secondary | ICD-10-CM

## 2010-07-23 LAB — LIPID PANEL
Cholesterol: 182 mg/dL (ref 0–200)
HDL: 76 mg/dL
LDL Cholesterol: 92 mg/dL (ref 0–99)
Total CHOL/HDL Ratio: 2
Triglycerides: 72 mg/dL (ref 0.0–149.0)
VLDL: 14.4 mg/dL (ref 0.0–40.0)

## 2010-07-23 LAB — CBC WITH DIFFERENTIAL/PLATELET
Basophils Absolute: 0 10*3/uL (ref 0.0–0.1)
Basophils Relative: 0.4 % (ref 0.0–3.0)
Eosinophils Absolute: 0.2 10*3/uL (ref 0.0–0.7)
Eosinophils Relative: 3 % (ref 0.0–5.0)
HCT: 32 % — ABNORMAL LOW (ref 36.0–46.0)
Hemoglobin: 11.1 g/dL — ABNORMAL LOW (ref 12.0–15.0)
Lymphocytes Relative: 31.6 % (ref 12.0–46.0)
Lymphs Abs: 1.7 10*3/uL (ref 0.7–4.0)
MCHC: 34.8 g/dL (ref 30.0–36.0)
MCV: 86.7 fl (ref 78.0–100.0)
Monocytes Absolute: 0.4 10*3/uL (ref 0.1–1.0)
Monocytes Relative: 7.2 % (ref 3.0–12.0)
Neutro Abs: 3 10*3/uL (ref 1.4–7.7)
Neutrophils Relative %: 57.8 % (ref 43.0–77.0)
Platelets: 225 10*3/uL (ref 150.0–400.0)
RBC: 3.7 Mil/uL — ABNORMAL LOW (ref 3.87–5.11)
RDW: 15.4 % — ABNORMAL HIGH (ref 11.5–14.6)
WBC: 5.3 10*3/uL (ref 4.5–10.5)

## 2010-07-23 LAB — URINALYSIS
Ketones, ur: NEGATIVE
Leukocytes, UA: NEGATIVE
Specific Gravity, Urine: >=1.03 (ref 1.000–1.030)
Urobilinogen, UA: 0.2 (ref 0.0–1.0)

## 2010-07-23 LAB — BASIC METABOLIC PANEL WITH GFR
BUN: 12 mg/dL (ref 6–23)
CO2: 26 meq/L (ref 19–32)
Calcium: 8.8 mg/dL (ref 8.4–10.5)
Chloride: 108 meq/L (ref 96–112)
Creatinine, Ser: 0.7 mg/dL (ref 0.4–1.2)
GFR: 124.52 mL/min
Glucose, Bld: 88 mg/dL (ref 70–99)
Potassium: 4.7 meq/L (ref 3.5–5.1)
Sodium: 139 meq/L (ref 135–145)

## 2010-07-23 LAB — HEPATIC FUNCTION PANEL
ALT: 8 U/L (ref 0–35)
AST: 12 U/L (ref 0–37)
Albumin: 3.5 g/dL (ref 3.5–5.2)
Alkaline Phosphatase: 55 U/L (ref 39–117)
Bilirubin, Direct: 0.1 mg/dL (ref 0.0–0.3)
Total Bilirubin: 0.4 mg/dL (ref 0.3–1.2)
Total Protein: 6.4 g/dL (ref 6.0–8.3)

## 2010-07-23 LAB — PSA: PSA: 0.03 ng/mL — ABNORMAL LOW (ref 0.10–4.00)

## 2010-07-23 LAB — TSH: TSH: 2.32 u[IU]/mL (ref 0.35–5.50)

## 2010-07-27 NOTE — Assessment & Plan Note (Signed)
Summary: cpx/#/cd   Vital Signs:  Patient profile:   40 year old female Weight:      138 pounds (62.73 kg) BMI:     21.69 O2 Sat:      95 % on Room air Temp:     98.3 degrees F (36.83 degrees C) oral Pulse rate:   74 / minute BP sitting:   100 / 78  (left arm) Cuff size:   regular  Vitals Entered By: Orlan Leavens RMA (July 23, 2010 8:46 AM)  O2 Flow:  Room air CC: CPX Is Patient Diabetic? No Pain Assessment Patient in pain? no        Primary Care Provider:  Newt Lukes MD  CC:  CPX.  History of Present Illness: patient is here today for annual physical. Patient feels well and has no complaints.    alos reviewed other chronic med issues:   continued polyuria -  a/w mild dysuria - onset summer 2011  small vol but freq need to void (hourly) course initially intermittent but almost constant now -  no hematuria or fever - s/p with abx x 3 (septra and cipro) - no relief with abx or with AZO -  symptoms all much worse with stress has seen uro (x 2 opinions) - now on OAB tx trial and planning cysto + other workup  constipation, chronic has been eval by GI for same early 2011-  using probiotic but symptoms worse with stress   Preventive Screening-Counseling & Management  Alcohol-Tobacco     Alcohol drinks/day: <1     Alcohol type: wine     Alcohol Counseling: not indicated; use of alcohol is not excessive or problematic     Smoking Status: quit     Packs/Day: <0.25     Year Quit: stopped age 68     Tobacco Counseling: not to resume use of tobacco products  Caffeine-Diet-Exercise     Does Patient Exercise: no     Exercise Counseling: to improve exercise regimen     Depression Counseling: not indicated; screening negative for depression  Safety-Violence-Falls     Seat Belt Counseling: not indicated; patient wears seat belts     Helmet Counseling: not applicable     Firearm Counseling: not applicable     Violence Counseling: not indicated; no violence  risk noted     Fall Risk Counseling: not indicated; no significant falls noted  Clinical Review Panels:  Lipid Management   Cholesterol:  197 (02/04/2010)   LDL (bad choesterol):  109 (02/04/2010)   HDL (good cholesterol):  78.90 (02/04/2010)  CBC   WBC:  4.2 (02/04/2010)   RBC:  3.77 (02/04/2010)   Hgb:  11.1 (02/04/2010)   Hct:  32.1 (02/04/2010)   Platelets:  253.0 (02/04/2010)   MCV  85.2 (02/04/2010)   MCHC  34.6 (02/04/2010)   RDW  16.1 (02/04/2010)   PMN:  49.3 (02/04/2010)   Lymphs:  36.6 (02/04/2010)   Monos:  9.1 (02/04/2010)   Eosinophils:  4.2 (02/04/2010)   Basophil:  0.8 (02/04/2010)  Complete Metabolic Panel   Glucose:  79 (02/04/2010)   Sodium:  140 (02/04/2010)   Potassium:  5.3 (02/04/2010)   Chloride:  106 (02/04/2010)   CO2:  26 (02/04/2010)   BUN:  10 (02/04/2010)   Creatinine:  0.7 (02/04/2010)   Albumin:  3.5 (02/04/2010)   Total Protein:  6.6 (02/04/2010)   Calcium:  8.8 (02/04/2010)   Total Bili:  0.3 (02/04/2010)   Alk Phos:  57 (02/04/2010)   SGPT (ALT):  10 (02/04/2010)   SGOT (AST):  17 (02/04/2010)   Current Medications (verified): 1)  Yaz 3-0.02 Mg Tabs (Drospirenone-Ethinyl Estradiol) .... One Tablet By Mouth Once Daily 2)  Multivitamins   Tabs (Multiple Vitamin) .... One Tablet By Mouth Once Daily 3)  Digestive Support  Caps (Digestive Enzymes) .... Take 2 By Mouth Once Daily 4)  Regularity (Ovc) .... Take 2 By Mouth Once Daily 5)  Oxybutynin Chloride 5 Mg Tabs (Oxybutynin Chloride) .... Take 1 Three Times A Day  Allergies (verified): 1)  ! Penicillin  Past History:  Past Medical History: IBS  ANEMIA, HX  CONSTIPATION  MD roster: GI - patterson  gyn - michelle bicklman ([facility]) uro - evans    Past Surgical History: Tubal Ligation (2003) Breast surgery-lump removal (left)    Family History: Reviewed history from 02/04/2010 and no changes required. No FH of Colon Cancer Family History of Irritable Bowel Syndrome:  Father ? Hx stroke (parent)  Social History: Married, lives with spouse Alcohol Use - yes-1 glass at bedtime  Illicit Drug Use - no Patient is an occ smoker-"only when stressed" Patient does not get regular exercise.  employed as Microbiologist Smoking Status:  quit  Review of Systems       see HPI above. I have reviewed all other systems and they were negative.    Physical Exam  General:  alert, well-developed, well-nourished, and cooperative to examination.    Head:  Normocephalic and atraumatic without obvious abnormalities. No apparent alopecia or balding. Eyes:  vision grossly intact; pupils equal, round and reactive to light.  conjunctiva and lids normal.    Ears:  normal pinnae bilaterally, without erythema, swelling, or tenderness to palpation. TMs clear, without effusion, or cerumen impaction. Hearing grossly normal bilaterally  Mouth:  teeth and gums in good repair; mucous membranes moist, without lesions or ulcers. oropharynx clear without exudate, no erythema.  Neck:  supple, full ROM, no masses, no thyromegaly; no thyroid nodules or tenderness. no JVD or carotid bruits.   Chest Wall:  No deformities, masses, or tenderness noted. Lungs:  normal respiratory effort, no intercostal retractions or use of accessory muscles; normal breath sounds bilaterally - no crackles and no wheezes.    Heart:  normal rate, regular rhythm, no murmur, and no rub. BLE without edema. normal DP pulses and normal cap refill in all 4 extremities    Abdomen:  soft, non-tender, normal bowel sounds, no distention; no masses and no appreciable hepatomegaly or splenomegaly.   Genitalia:  defer gyn Msk:  No deformity or scoliosis noted of thoracic or lumbar spine.   Neurologic:  alert & oriented X3 and cranial nerves II-XII symetrically intact.  strength normal in all extremities, sensation intact to light touch, and gait normal. speech fluent without dysarthria or aphasia; follows commands with  good comprehension.  Skin:  no rashes, vesicles, ulcers, or erythema. No nodules or irregularity to palpation.  Psych:  Oriented X3, memory intact for recent and remote, normally interactive, good eye contact, not anxious appearing, not depressed appearing, and not agitated.      Impression & Recommendations:  Problem # 1:  PREVENTIVE HEALTH CARE (ICD-V70.0)  Patient has been counseled on age-appropriate routine health concerns for screening and prevention. These are reviewed and up-to-date. Immunizations are up-to-date or declined. Labs ordered and will be reviewed.   Orders: TLB-BMP (Basic Metabolic Panel-BMET) (80048-METABOL) TLB-CBC Platelet - w/Differential (85025-CBCD) TLB-Hepatic/Liver Function Pnl (80076-HEPATIC) TLB-Lipid Panel (80061-LIPID)  TLB-PSA (Prostate Specific Antigen) (84153-PSA) TLB-TSH (Thyroid Stimulating Hormone) (84443-TSH) TLB-Udip ONLY (81003-UDIP)  Complete Medication List: 1)  Yaz 3-0.02 Mg Tabs (Drospirenone-ethinyl estradiol) .... One tablet by mouth once daily 2)  Multivitamins Tabs (Multiple vitamin) .... One tablet by mouth once daily 3)  Digestive Support Caps (Digestive enzymes) .... Take 2 by mouth once daily 4)  Regularity (ovc)  .... Take 2 by mouth once daily 5)  Oxybutynin Chloride 5 Mg Tabs (Oxybutynin chloride) .... Take 1 three times a day  Patient Instructions: 1)  it was good to see you today. 2)  exam looks good today 3)  test(s) ordered today - your results will be posted on the phone tree for review in 48-72 hours from the time of test completion; call [phone] and enter your 9 digit MRN (listed above on this page, just below your name); if any changes need to be made or there are abnormal results, you will be contacted directly.  4)  continue to work with your specialists as ongoing - urology, GI and gynecology  - keep [place] updated of any changes 5)  Please schedule a follow-up appointment in 6 months to review symptoms, call sooner if  problems.    Orders Added: 1)  Est. Patient 18-39 years [99395] 2)  TLB-BMP (Basic Metabolic Panel-BMET) [80048-METABOL] 3)  TLB-CBC Platelet - w/Differential [85025-CBCD] 4)  TLB-Hepatic/Liver Function Pnl [80076-HEPATIC] 5)  TLB-Lipid Panel [80061-LIPID] 6)  TLB-PSA (Prostate Specific Antigen) [84153-PSA] 7)  TLB-TSH (Thyroid Stimulating Hormone) [84443-TSH] 8)  TLB-Udip ONLY [81003-UDIP]

## 2010-08-22 LAB — GC/CHLAMYDIA PROBE AMP, GENITAL: Chlamydia, DNA Probe: NEGATIVE

## 2010-08-22 LAB — POCT URINALYSIS DIP (DEVICE)
Bilirubin Urine: NEGATIVE
Nitrite: NEGATIVE
Urobilinogen, UA: 0.2 mg/dL (ref 0.0–1.0)
pH: 8.5 — ABNORMAL HIGH (ref 5.0–8.0)

## 2010-08-22 LAB — WET PREP, GENITAL: Trich, Wet Prep: NONE SEEN

## 2010-10-01 NOTE — Op Note (Signed)
[facility]  Patient:    Joyce [name], Joyce [name] Visit Number: [id number] MRN: [id number]          Service Type: DSU Location: [facility] Attending Physician:  Melony Overly Dictated by:   Devoria Albe Edward Jolly, M.D. Proc. Date: 09/07/01 Admit Date:  09/07/2001                             Operative Report  DATE OF BIRTH:                [birth year]  PREOPERATIVE DIAGNOSES:       1. Multiparous female.                               2. Desire for permanent sterilization.  POSTOPERATIVE DIAGNOSES:      1. Multiparous female.                               2. Desire for permanent sterilization.  OPERATION:                    Laparoscopic bilateral tubal ligation with                               fulguration.  SURGEON:                      Brook A. Edward Jolly, M.D.  IV FLUIDS:                    1100 cc of Ringers lactate.  ESTIMATED BLOOD LOSS:         Minimal.  URINE OUTPUT:                 50 cc.  COMPLICATIONS:                None.  INDICATIONS FOR PROCEDURE:    The patient was a 40 year old gravida 4, para 3-0-1-3 female, status post vaginal delivery of a viable female infant on July 29, 2001, who requested permanent sterilization.  The risks, benefits, and alternatives were discussed with the patient.  The patient was educated about the risks of failure of the procedure which is approximately 1 in 250 to 1 in 300 which may result in either an intrauterine or ectopic pregnancy.  The patient still chose to proceed with the surgery.  FINDINGS:                     Exam under anesthesia revealed a small, anteverted, mobile uterus.  No adnexal masses were appreciated.  Laparoscopy demonstrated a normal uterus, bilateral tubes, and bilateral ovaries.  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  There was some slight puckering of the peritoneum noted along the right pelvic side wall lateral to the uterosacral  ligaments.  There were no obvious lesions of endometriosis.  The appendix and liver were noted to be normal.  SPECIMENS:                    None.  DESCRIPTION OF PROCEDURE:     With an IV in place, the patient was taken to the operating room after she was properly identified.  She did received Ancef 1 g intravenously for antibiotic prophylaxis.  The patient was placed in a supine position on the operating room table and then she received general endotracheal anesthesia.  She was then placed in the dorsolithotomy position, and the abdomen and vagina were sterilely prepped and draped.  A Foley catheter was placed inside the urinary bladder and an examination under anesthesia was performed.  A speculum was then placed inside the vagina and a single tooth tenaculum was placed on the anterior cervical lip.  This was replaced by a Hulka tenaculum and the remaining vaginal instruments were removed.  The procedure began by making an incision in the umbilicus sharply with the scalpel.  This was carried down to the fascia using an Allis clamp.  A 10 mm trocar was then inserted directly into the peritoneal cavity and the laparoscope confirmed proper placement.  A pneumoperitoneum was achieved with CO2 gas, and the patient was then placed in the Trendelenburg position.  A 5 mm incision was created suprapubically with the scalpel, and a 5 mm trocar was inserted under visualization of the laparoscope.  A blunt-tip probe was then placed inside the peritoneal cavity and examination of the pelvic and abdominal organs was performed, and the findings are as noted above.  The Klepinger forceps was then used to grasp and elevate the right fallopian tube after it was followed all the way to its fimbriated end.  A contiguous suction of approximately 3 cm of fallopian tube was cauterized with the bipolar cautery instrument.  The same procedure that was performed on the right fallopian tube was then repeated  on the left fallopian tube after it was grasped and followed all the way to its fimbriated end.  The 5 mm trocar was then removed under visualization of the laparoscope and the pneumoperitoneum was released.  The umbilical trocar and the laparoscope were removed simultaneously.  The skin incisions were closed with subcuticular sutures of 3-0 plain.  They were covered by sterile bandages.  The remaining vaginal instruments and the Foley catheter were removed.  The patient was cleansed with Betadine, awakened, and extubated, and escorted to the recovery room in stable and awake condition.  There were no complications to this procedure.  All needle, instrument, and sponge counts were correct. Dictated by:   Devoria Albe Edward Jolly, M.D. Attending Physician:  Melony Overly DD:  09/07/01 TD:  09/08/01 Job: [id number]

## 2011-02-04 LAB — INFLUENZA A AND B ANTIGEN (CONVERTED LAB)
Inflenza A Ag: NEGATIVE
Influenza B Ag: NEGATIVE

## 2011-03-09 ENCOUNTER — Telehealth: Payer: Self-pay | Admitting: Gastroenterology

## 2011-03-09 NOTE — Telephone Encounter (Signed)
lmom for pt to call back. Pt with hx of chronic functional constipation c/w colonic inertia. Pt was started on a trial of amitiza 24 mcg x 4 weeks. Her next OV was 05/30/08 and her orders were ; benefiber with juice qam, Miralax qhs, Amitiza bid. She f/u 06/27/08 and stated she took the Miralax only on weekends d/t work. Amitiza was increased to bid unless she develops diarrhea and sitz markers- almost all were in the sigmoid area on xray. Pt was to continue Benefiber and liberal fluids., changed to amitiza 24 mcg bid and kristolose qhs. 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 lmom for pt to call back.

## 2011-03-09 NOTE — Telephone Encounter (Signed)
Informed pt Dr Jarold Motto is on vacation and I'm not sure with her hx that a mid level can help her. We are still using the same drugs to tx constipation like Amitiza and we've added Align for some pts. Pt agreed stating she has been using organic laxatives because she still doesn't have a BM except q4 days. She is going to start Miralax this weekend because she doesn't wand to have diarrhea at work. She takes Benefiber, but can't tolerate metamucil. Pt will see Dr Jarold Motto on 04/01/11.

## 2011-03-16 ENCOUNTER — Encounter: Payer: Self-pay | Admitting: Internal Medicine

## 2011-04-01 ENCOUNTER — Ambulatory Visit: Payer: Managed Care, Other (non HMO) | Admitting: Gastroenterology

## 2011-04-01 ENCOUNTER — Ambulatory Visit: Payer: Managed Care, Other (non HMO) | Admitting: Internal Medicine

## 2013-04-04 ENCOUNTER — Other Ambulatory Visit: Payer: Self-pay | Admitting: Obstetrics and Gynecology

## 2013-04-15 LAB — HM PAP SMEAR

## 2013-04-15 LAB — HM MAMMOGRAPHY

## 2013-05-31 ENCOUNTER — Ambulatory Visit (INDEPENDENT_AMBULATORY_CARE_PROVIDER_SITE_OTHER): Payer: PRIVATE HEALTH INSURANCE | Admitting: Family

## 2013-05-31 ENCOUNTER — Encounter: Payer: Self-pay | Admitting: Family

## 2013-05-31 VITALS — BP 110/80 | HR 98 | Temp 98.2°F | Wt 154.0 lb

## 2013-05-31 DIAGNOSIS — J309 Allergic rhinitis, unspecified: Secondary | ICD-10-CM

## 2013-05-31 DIAGNOSIS — J019 Acute sinusitis, unspecified: Secondary | ICD-10-CM

## 2013-05-31 MED ORDER — FLUTICASONE PROPIONATE 50 MCG/ACT NA SUSP: 2.0000 | Freq: Every day | NASAL | Status: DC

## 2013-05-31 MED ORDER — AZITHROMYCIN 250 MG PO TABS: ORAL_TABLET | ORAL | Status: DC

## 2013-05-31 NOTE — Progress Notes (Signed)
Pre visit review using our clinic review tool, if applicable. No additional management support is needed unless otherwise documented below in the visit note. 

## 2013-05-31 NOTE — Progress Notes (Signed)
Subjective:    Patient ID: Joyce [name], female    DOB: April 08, 1971, 43 y.o.   MRN: [id number]  HPI  43 y.o. Black female that presents with chief complain of sinus congestion. Congestion started one week ago with a congested nose and light headed feeling. Pt began to have clear nasal discharge the next day. Denies cough, fever, pain. Pt acknowledges beginning to have chills today, fatigue and generalized body aches.     Review of Systems  Constitutional: Positive for chills and fatigue.  HENT: Positive for congestion, sinus pressure and sneezing.   Eyes: Negative.   Respiratory: Negative.   Cardiovascular: Negative.   Gastrointestinal: Negative.   Endocrine: Negative.   Genitourinary: Negative.   Musculoskeletal: Negative.   Skin: Negative.   Allergic/Immunologic: Negative.   Neurological: Positive for light-headedness.  Hematological: Negative.   Psychiatric/Behavioral: Negative.    Past Medical History  Diagnosis Date  . ANEMIA, HX OF 05/28/2008  . CHICKENPOX, HX OF 02/04/2010  . CONSTIPATION 05/28/2008  . DYSURIA, CHRONIC 03/18/2010  . GENITAL HERPES 02/04/2010  . Headache(784.0) 02/04/2010  . IBS 05/30/2008  . Polyuria 02/04/2010  . TOBACCO USE, QUIT 02/04/2010  . VAGINITIS, CANDIDAL 03/18/2010    History   Social History  . Marital Status: Married    Spouse Name: N/A    Number of Children: N/A  . Years of Education: N/A   Occupational History  . Not on file.   Social History Main Topics  . Smoking status: Current Some Day Smoker  . Smokeless tobacco: Not on file     Comment: Only smokes when she is stress. Married, lives with spouse  . Alcohol Use: Yes     Comment: 1 glass at bedtime  . Drug Use: No  . Sexual Activity:    Other Topics Concern  . Not on file   Social History Narrative  . No narrative on file    Past Surgical History  Procedure Laterality Date  . Tubal ligation  2003  . Breast surgery-lump removed      Left    Family History    Problem Relation Age of Onset  . Irritable bowel syndrome Father   . Stroke Other     Allergies  Allergen Reactions  . Penicillins     REACTION: rash    Current Outpatient Prescriptions on File Prior to Visit  Medication Sig Dispense Refill  . Digestive Enzymes (DIGESTIVE SUPPORT PO) Take by mouth daily.        . drospirenone-ethinyl estradiol (YAZ,GIANVI,LORYNA) 3-0.02 MG tablet Take 1 tablet by mouth daily.        . Multiple Vitamin (MULTIVITAMIN) tablet Take 1 tablet by mouth daily.        Marland Kitchen oxybutynin (DITROPAN) 5 MG tablet Take 5 mg by mouth 3 (three) times daily.         No current facility-administered medications on file prior to visit.    BP 110/80  Pulse 98  Temp(Src) 98.2 F (36.8 C) (Oral)  Wt 154 lb (69.854 kg)chart    Objective:   Physical Exam  Constitutional: She is oriented to person, place, and time. Vital signs are normal. She appears well-developed and well-nourished. She is active.  HENT:  Head: Normocephalic.  Right Ear: Tympanic membrane, external ear and ear canal normal.  Left Ear: Tympanic membrane, external ear and ear canal normal.  Nose: Rhinorrhea present. Right sinus exhibits maxillary sinus tenderness and frontal sinus tenderness. Left sinus exhibits maxillary sinus tenderness and frontal  sinus tenderness.  Mouth/Throat: Uvula is midline, oropharynx is clear and moist and mucous membranes are normal.  Boggy nasal canal/septum  Eyes: Conjunctivae and lids are normal. Pupils are equal, round, and reactive to light.  Neck: Normal range of motion.  Cardiovascular: Normal rate, regular rhythm, normal heart sounds and normal pulses.   Pulmonary/Chest: Effort normal.  Abdominal: Soft. Normal appearance and bowel sounds are normal.  Neurological: She is alert and oriented to person, place, and time.  Skin: Skin is warm, dry and intact.  Psychiatric: She has a normal mood and affect. Her speech is normal and behavior is normal.           Assessment & Plan:  Assessment:  1. Acute Sinusitis  2. Allergic Rhinitis   42  Y.o. Female with complaint of sinus inflammation.  - Flonase 2 sprays in each nostril once a day -Zpak as directed Call the office if symptoms worsen or persist. Recheck as scheduled and as needed.

## 2013-05-31 NOTE — Patient Instructions (Signed)

## 2013-06-07 ENCOUNTER — Encounter: Payer: Self-pay | Admitting: Internal Medicine

## 2013-06-07 ENCOUNTER — Ambulatory Visit (INDEPENDENT_AMBULATORY_CARE_PROVIDER_SITE_OTHER): Payer: PRIVATE HEALTH INSURANCE | Admitting: Internal Medicine

## 2013-06-07 VITALS — BP 110/60 | HR 80 | Ht 66.0 in | Wt 154.0 lb

## 2013-06-07 DIAGNOSIS — R140 Abdominal distension (gaseous): Secondary | ICD-10-CM

## 2013-06-07 DIAGNOSIS — R142 Eructation: Secondary | ICD-10-CM

## 2013-06-07 DIAGNOSIS — R143 Flatulence: Secondary | ICD-10-CM

## 2013-06-07 DIAGNOSIS — R141 Gas pain: Secondary | ICD-10-CM

## 2013-06-07 DIAGNOSIS — K625 Hemorrhage of anus and rectum: Secondary | ICD-10-CM

## 2013-06-07 DIAGNOSIS — K219 Gastro-esophageal reflux disease without esophagitis: Secondary | ICD-10-CM | POA: Insufficient documentation

## 2013-06-07 DIAGNOSIS — K5900 Constipation, unspecified: Secondary | ICD-10-CM

## 2013-06-07 MED ORDER — LINACLOTIDE 290 MCG PO CAPS: 290.0000 ug | ORAL_CAPSULE | Freq: Every day | ORAL | Status: DC

## 2013-06-07 NOTE — Progress Notes (Signed)
HISTORY OF PRESENT ILLNESS:  Joyce [name] is a 43 y.o. female , former patient of Dr. Sheryn Bisonavid Patterson, with chronic idiopathic constipation. Abnormal Sitz marker test previously. Colonoscopy February 2011 revealing only melanosis coli and hypertrophied anal papilla. Last seen by Dr. Jarold MottoPatterson in the office February 2011. Patient has not been able to tolerate Metamucil or Amitiza. Has used MiraLax sporadically and does not feel that it works. She comes in now with complaints of ongoing constipation. Severe episode last weekend with minor rectal bleeding. She takes over-the-counter laxatives on a near daily basis. She averages a bowel movement once every 4 days. She does have bloating. Abdominal discomfort only after not moving her bowels for several days. GI review of systems is otherwise negative. She requested FMLA form so that she might be able to be late for work, without panel be, if she is having trouble with her bowels  REVIEW OF SYSTEMS:  All non-GI ROS negative except for sinus and allergy, headaches  Past Medical History  Diagnosis Date  . ANEMIA, HX OF 05/28/2008  . CHICKENPOX, HX OF 02/04/2010  . CONSTIPATION 05/28/2008  . DYSURIA, CHRONIC 03/18/2010  . GENITAL HERPES 02/04/2010  . Headache(784.0) 02/04/2010  . IBS 05/30/2008  . Polyuria 02/04/2010  . TOBACCO USE, QUIT 02/04/2010  . VAGINITIS, CANDIDAL 03/18/2010    Past Surgical History  Procedure Laterality Date  . Tubal ligation  2003  . Breast lumpectomy Left     Social History Joyce PiliHolley M Danzy  reports that she has been smoking Cigarettes.  She has been smoking about 0.00 packs per day. She has never used smokeless tobacco. She reports that she drinks alcohol. She reports that she does not use illicit drugs.  family history includes Hypertension in her mother; Irritable bowel syndrome in her father.  Allergies  Allergen Reactions  . Peach [Prunus Persica]   . Penicillins     REACTION: rash       PHYSICAL  EXAMINATION: Vital signs: BP 110/60  Pulse 80  Ht 5\' 6"  (1.676 m)  Wt 154 lb (69.854 kg)  BMI 24.87 kg/m2  LMP 05/03/2013  Constitutional: generally well-appearing, no acute distress Psychiatric: alert and oriented x3, cooperative Eyes: extraocular movements intact, anicteric, conjunctiva pink Mouth: oral pharynx moist, no lesions Neck: supple no lymphadenopathy Cardiovascular: heart regular rate and rhythm, no murmur Lungs: clear to auscultation bilaterally Abdomen: soft, nontender, nondistended, no obvious ascites, no peritoneal signs, normal bowel sounds, no organomegaly Rectal: Small hemorrhoid. Otherwise negative exam. Extremities: no lower extremity edema bilaterally Skin: no lesions on visible extremities Neuro: No focal deficits.   ASSESSMENT:  1. Functional constipation. Ongoing 2. Minor rectal bleeding due to benign anorectal disease (small hemorrhoid) 3. Normal colonoscopy 2011  PLAN:  1. Trial of Linzess 290 mcg daily. Samples given. This is helpful, contact the office and we would be happy to call in a prescription for her 2. Declined filling out FMLA as requested

## 2013-06-07 NOTE — Patient Instructions (Addendum)
You have been given some samples of Linzess.  Take one 30 minutes before your first meal of the day.  If this works for you, call our office and we will send in a prescription

## 2013-09-06 ENCOUNTER — Encounter (HOSPITAL_COMMUNITY): Payer: Self-pay | Admitting: Emergency Medicine

## 2013-09-06 ENCOUNTER — Emergency Department (INDEPENDENT_AMBULATORY_CARE_PROVIDER_SITE_OTHER): Payer: PRIVATE HEALTH INSURANCE

## 2013-09-06 ENCOUNTER — Emergency Department (INDEPENDENT_AMBULATORY_CARE_PROVIDER_SITE_OTHER)
Admission: EM | Admit: 2013-09-06 | Discharge: 2013-09-06 | Disposition: A | Discharge status: Home / Self Care | Payer: PRIVATE HEALTH INSURANCE | Source: Home / Self Care | Attending: Emergency Medicine | Admitting: Emergency Medicine

## 2013-09-06 DIAGNOSIS — IMO0002 Reserved for concepts with insufficient information to code with codable children: Secondary | ICD-10-CM

## 2013-09-06 DIAGNOSIS — S239XXA Sprain of unspecified parts of thorax, initial encounter: Secondary | ICD-10-CM

## 2013-09-06 MED ORDER — IBUPROFEN 800 MG PO TABS
ORAL_TABLET | ORAL | Status: AC
  Filled 2013-09-06: qty 1

## 2013-09-06 MED ORDER — IBUPROFEN 800 MG PO TABS
800.0000 mg | ORAL_TABLET | Freq: Once | ORAL | Status: AC
  Administered 2013-09-06: 800 mg via ORAL

## 2013-09-06 MED ORDER — CYCLOBENZAPRINE HCL 10 MG PO TABS: 10.0000 mg | ORAL_TABLET | Freq: Three times a day (TID) | ORAL | Status: DC | PRN
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
## 2013-09-07 NOTE — ED Provider Notes (Signed)
Medical screening examination/treatment/procedure(s) were performed by non-physician practitioner and as supervising physician I was immediately available for consultation/collaboration.  Collen Hostler, M.D.  Alliya Marcon C Blen Ransome, MD 09/07/13 1422 

## 2013-09-09 ENCOUNTER — Encounter (HOSPITAL_COMMUNITY): Payer: Self-pay | Admitting: Emergency Medicine

## 2013-09-09 ENCOUNTER — Emergency Department (HOSPITAL_COMMUNITY)
Admission: EM | Admit: 2013-09-09 | Discharge: 2013-09-10 | Disposition: A | Discharge status: Home / Self Care | Payer: PRIVATE HEALTH INSURANCE | Attending: Emergency Medicine | Admitting: Emergency Medicine

## 2013-09-09 ENCOUNTER — Emergency Department (HOSPITAL_COMMUNITY): Payer: PRIVATE HEALTH INSURANCE

## 2013-09-09 DIAGNOSIS — Z862 Personal history of diseases of the blood and blood-forming organs and certain disorders involving the immune mechanism: Secondary | ICD-10-CM | POA: Insufficient documentation

## 2013-09-09 DIAGNOSIS — F17200 Nicotine dependence, unspecified, uncomplicated: Secondary | ICD-10-CM | POA: Insufficient documentation

## 2013-09-09 DIAGNOSIS — Z8619 Personal history of other infectious and parasitic diseases: Secondary | ICD-10-CM | POA: Insufficient documentation

## 2013-09-09 DIAGNOSIS — Z79899 Other long term (current) drug therapy: Secondary | ICD-10-CM | POA: Insufficient documentation

## 2013-09-09 DIAGNOSIS — Z880 Allergy status to penicillin: Secondary | ICD-10-CM | POA: Insufficient documentation

## 2013-09-09 DIAGNOSIS — Z8719 Personal history of other diseases of the digestive system: Secondary | ICD-10-CM | POA: Insufficient documentation

## 2013-09-09 DIAGNOSIS — IMO0002 Reserved for concepts with insufficient information to code with codable children: Secondary | ICD-10-CM | POA: Insufficient documentation

## 2013-09-09 DIAGNOSIS — F0781 Postconcussional syndrome: Secondary | ICD-10-CM | POA: Insufficient documentation
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
## 2013-09-10 NOTE — Discharge Instructions (Signed)
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
## 2013-09-13 ENCOUNTER — Ambulatory Visit: Payer: Self-pay | Admitting: Neurology

## 2013-10-11 ENCOUNTER — Ambulatory Visit: Payer: PRIVATE HEALTH INSURANCE | Admitting: Internal Medicine

## 2013-10-25 ENCOUNTER — Other Ambulatory Visit (INDEPENDENT_AMBULATORY_CARE_PROVIDER_SITE_OTHER): Payer: PRIVATE HEALTH INSURANCE

## 2013-10-25 ENCOUNTER — Encounter: Payer: Self-pay | Admitting: *Deleted

## 2013-10-25 ENCOUNTER — Encounter: Payer: Self-pay | Admitting: Internal Medicine

## 2013-10-25 ENCOUNTER — Ambulatory Visit (INDEPENDENT_AMBULATORY_CARE_PROVIDER_SITE_OTHER): Payer: PRIVATE HEALTH INSURANCE | Admitting: Internal Medicine

## 2013-10-25 VITALS — BP 110/80 | HR 89 | Temp 98.1°F | Ht 66.0 in | Wt 158.0 lb

## 2013-10-25 DIAGNOSIS — Z0000 Encounter for general adult medical examination without abnormal findings: Secondary | ICD-10-CM

## 2013-10-25 LAB — BASIC METABOLIC PANEL
BUN: 9 mg/dL (ref 6–23)
CALCIUM: 8.4 mg/dL (ref 8.4–10.5)
CO2: 28 mEq/L (ref 19–32)
Chloride: 108 mEq/L (ref 96–112)
Creatinine, Ser: 0.7 mg/dL (ref 0.4–1.2)
GFR: 120.41 mL/min (ref >60.00)
GLUCOSE: 94 mg/dL (ref 70–99)
POTASSIUM: 3.6 meq/L (ref 3.5–5.1)
SODIUM: 139 meq/L (ref 135–145)

## 2013-10-25 LAB — CBC WITH DIFFERENTIAL/PLATELET
BASOS PCT: 0.4 % (ref 0.0–3.0)
Basophils Absolute: 0 10*3/uL (ref 0.0–0.1)
EOS PCT: 1.9 % (ref 0.0–5.0)
Eosinophils Absolute: 0.1 10*3/uL (ref 0.0–0.7)
HEMATOCRIT: 34.7 % — AB (ref 36.0–46.0)
HEMOGLOBIN: 11.9 g/dL — AB (ref 12.0–15.0)
LYMPHS ABS: 1.5 10*3/uL (ref 0.7–4.0)
LYMPHS PCT: 26.5 % (ref 12.0–46.0)
MCHC: 34.2 g/dL (ref 30.0–36.0)
MCV: 85.8 fl (ref 78.0–100.0)
MONOS PCT: 7.3 % (ref 3.0–12.0)
Monocytes Absolute: 0.4 10*3/uL (ref 0.1–1.0)
NEUTROS ABS: 3.7 10*3/uL (ref 1.4–7.7)
Neutrophils Relative %: 63.9 % (ref 43.0–77.0)
Platelets: 319 10*3/uL (ref 150.0–400.0)
RBC: 4.04 Mil/uL (ref 3.87–5.11)
RDW: 14.2 % (ref 11.5–15.5)
WBC: 5.8 10*3/uL (ref 4.0–10.5)

## 2013-10-25 LAB — LIPID PANEL
CHOLESTEROL: 160 mg/dL (ref 0–200)
HDL: 67.5 mg/dL (ref >39.00)
LDL Cholesterol: 80 mg/dL (ref 0–99)
NonHDL: 92.5
Total CHOL/HDL Ratio: 2
Triglycerides: 64 mg/dL (ref 0.0–149.0)
VLDL: 12.8 mg/dL (ref 0.0–40.0)

## 2013-10-25 LAB — URINALYSIS, ROUTINE W REFLEX MICROSCOPIC
BILIRUBIN URINE: NEGATIVE
Hgb urine dipstick: NEGATIVE
Ketones, ur: NEGATIVE
LEUKOCYTES UA: NEGATIVE
NITRITE: NEGATIVE
PH: 5.5 (ref 5.0–8.0)
Specific Gravity, Urine: >=1.03 — AB (ref 1.000–1.030)
TOTAL PROTEIN, URINE-UPE24: NEGATIVE
Urine Glucose: NEGATIVE
Urobilinogen, UA: 0.2 (ref 0.0–1.0)

## 2013-10-25 LAB — HEPATIC FUNCTION PANEL
ALBUMIN: 3.3 g/dL — AB (ref 3.5–5.2)
ALK PHOS: 67 U/L (ref 39–117)
ALT: 8 U/L (ref 0–35)
AST: 12 U/L (ref 0–37)
Bilirubin, Direct: 0 mg/dL (ref 0.0–0.3)
TOTAL PROTEIN: 6.8 g/dL (ref 6.0–8.3)
Total Bilirubin: 0.3 mg/dL (ref 0.2–1.2)

## 2013-10-25 LAB — TSH: TSH: 0.93 u[IU]/mL (ref 0.35–4.50)

## 2013-10-25 NOTE — Patient Instructions (Addendum)
It was good to see you today.  We have reviewed your prior records including labs and tests today  Health Maintenance reviewed - all recommended immunizations and age-appropriate screenings are up-to-date.  Test(s) ordered today. Your results will be released to [place] (or called to you) after review, usually within 72hours after test completion. If any changes need to be made, you will be notified at that same time.  Medications reviewed and updated, no changes recommended at this time. Try over-the-counter probiotic supplements such as Align or [employer] for bowels to reduce constipation symptoms and improve IBS symptoms  Please schedule followup in 12 months for annual exam and labs, call sooner if problems.  Health Maintenance, Female A healthy lifestyle and preventative care can promote health and wellness.  Maintain regular health, dental, and eye exams.  Eat a healthy diet. Foods like vegetables, fruits, whole grains, low-fat dairy products, and lean protein foods contain the nutrients you need without too many calories. Decrease your intake of foods high in solid fats, added sugars, and salt. Get information about a proper diet from your caregiver, if necessary.  Regular physical exercise is one of the most important things you can do for your health. Most adults should get at least 150 minutes of moderate-intensity exercise (any activity that increases your heart rate and causes you to sweat) each week. In addition, most adults need muscle-strengthening exercises on 2 or more days a week.   Maintain a healthy weight. The body mass index (BMI) is a screening tool to identify possible weight problems. It provides an estimate of body fat based on height and weight. Your caregiver can help determine your BMI, and can help you achieve or maintain a healthy weight. For adults 20 years and older:  A BMI below 18.5 is considered underweight.  A BMI of 18.5 to 24.9 is  normal.  A BMI of 25 to 29.9 is considered overweight.  A BMI of 30 and above is considered obese.  Maintain normal blood lipids and cholesterol by exercising and minimizing your intake of saturated fat. Eat a balanced diet with plenty of fruits and vegetables. Blood tests for lipids and cholesterol should begin at age 29 and be repeated every 5 years. If your lipid or cholesterol levels are high, you are over 50, or you are a high risk for heart disease, you may need your cholesterol levels checked more frequently.Ongoing high lipid and cholesterol levels should be treated with medicines if diet and exercise are not effective.  If you smoke, find out from your caregiver how to quit. If you do not use tobacco, do not start.  Lung cancer screening is recommended for adults aged 72 80 years who are at high risk for developing lung cancer because of a history of smoking. Yearly low-dose computed tomography (CT) is recommended for people who have at least a 30-pack-year history of smoking and are a current smoker or have quit within the past 15 years. A pack year of smoking is smoking an average of 1 pack of cigarettes a day for 1 year (for example: 1 pack a day for 30 years or 2 packs a day for 15 years). Yearly screening should continue until the smoker has stopped smoking for at least 15 years. Yearly screening should also be stopped for people who develop a health problem that would prevent them from having lung cancer treatment.  If you are pregnant, do not drink alcohol. If you are breastfeeding, be very cautious about  drinking alcohol. If you are not pregnant and choose to drink alcohol, do not exceed 1 drink per day. One drink is considered to be 12 ounces (355 mL) of beer, 5 ounces (148 mL) of wine, or 1.5 ounces (44 mL) of liquor.  Avoid use of street drugs. Do not share needles with anyone. Ask for help if you need support or instructions about stopping the use of drugs.  High blood pressure  causes heart disease and increases the risk of stroke. Blood pressure should be checked at least every 1 to 2 years. Ongoing high blood pressure should be treated with medicines, if weight loss and exercise are not effective.  If you are 74 to 41 years old, ask your caregiver if you should take aspirin to prevent strokes.  Diabetes screening involves taking a blood sample to check your fasting blood sugar level. This should be done once every 3 years, after age 30, if you are within normal weight and without risk factors for diabetes. Testing should be considered at a younger age or be carried out more frequently if you are overweight and have at least 1 risk factor for diabetes.  Breast cancer screening is essential preventative care for women. You should practice "breast self-awareness." This means understanding the normal appearance and feel of your breasts and may include breast self-examination. Any changes detected, no matter how small, should be reported to a caregiver. Women in their 22s and 30s should have a clinical breast exam (CBE) by a caregiver as part of a regular health exam every 1 to 3 years. After age 55, women should have a CBE every year. Starting at age 41, women should consider having a mammogram (breast X-ray) every year. Women who have a family history of breast cancer should talk to their caregiver about genetic screening. Women at a high risk of breast cancer should talk to their caregiver about having an MRI and a mammogram every year.  Breast cancer gene (BRCA)-related cancer risk assessment is recommended for women who have family members with BRCA-related cancers. BRCA-related cancers include breast, ovarian, tubal, and peritoneal cancers. Having family members with these cancers may be associated with an increased risk for harmful changes (mutations) in the breast cancer genes BRCA1 and BRCA2. Results of the assessment will determine the need for genetic counseling and BRCA1  and BRCA2 testing.  The Pap test is a screening test for cervical cancer. Women should have a Pap test starting at age 57. Between ages 33 and 70, Pap tests should be repeated every 2 years. Beginning at age 44, you should have a Pap test every 3 years as long as the past 3 Pap tests have been normal. If you had a hysterectomy for a problem that was not cancer or a condition that could lead to cancer, then you no longer need Pap tests. If you are between ages 83 and 64, and you have had normal Pap tests going back 10 years, you no longer need Pap tests. If you have had past treatment for cervical cancer or a condition that could lead to cancer, you need Pap tests and screening for cancer for at least 20 years after your treatment. If Pap tests have been discontinued, risk factors (such as a new sexual partner) need to be reassessed to determine if screening should be resumed. Some women have medical problems that increase the chance of getting cervical cancer. In these cases, your caregiver may recommend more frequent screening and Pap tests.  The  human papillomavirus (HPV) test is an additional test that may be used for cervical cancer screening. The HPV test looks for the virus that can cause the cell changes on the cervix. The cells collected during the Pap test can be tested for HPV. The HPV test could be used to screen women aged 66 years and older, and should be used in women of any age who have unclear Pap test results. After the age of 68, women should have HPV testing at the same frequency as a Pap test.  Colorectal cancer can be detected and often prevented. Most routine colorectal cancer screening begins at the age of 36 and continues through age 53. However, your caregiver may recommend screening at an earlier age if you have risk factors for colon cancer. On a yearly basis, your caregiver may provide home test kits to check for hidden blood in the stool. Use of a small camera at the end of a tube,  to directly examine the colon (sigmoidoscopy or colonoscopy), can detect the earliest forms of colorectal cancer. Talk to your caregiver about this at age 88, when routine screening begins. Direct examination of the colon should be repeated every 5 to 10 years through age 24, unless early forms of pre-cancerous polyps or small growths are found.  Hepatitis C blood testing is recommended for all people born from [birth year] through 1963 and any individual with known risks for hepatitis C.  Practice safe sex. Use condoms and avoid high-risk sexual practices to reduce the spread of sexually transmitted infections (STIs). Sexually active women aged 109 and younger should be checked for Chlamydia, which is a common sexually transmitted infection. Older women with new or multiple partners should also be tested for Chlamydia. Testing for other STIs is recommended if you are sexually active and at increased risk.  Osteoporosis is a disease in which the bones lose minerals and strength with aging. This can result in serious bone fractures. The risk of osteoporosis can be identified using a bone density scan. Women ages 41 and over and women at risk for fractures or osteoporosis should discuss screening with their caregivers. Ask your caregiver whether you should be taking a calcium supplement or vitamin D to reduce the rate of osteoporosis.  Menopause can be associated with physical symptoms and risks. Hormone replacement therapy is available to decrease symptoms and risks. You should talk to your caregiver about whether hormone replacement therapy is right for you.  Use sunscreen. Apply sunscreen liberally and repeatedly throughout the day. You should seek shade when your shadow is shorter than you. Protect yourself by wearing long sleeves, pants, a wide-brimmed hat, and sunglasses year round, whenever you are outdoors.  Notify your caregiver of new moles or changes in moles, especially if there is a change in shape or  color. Also notify your caregiver if a mole is larger than the size of a pencil eraser.  Stay current with your immunizations. Document Released: 11/15/2010 Document Revised: 08/27/2012 Document Reviewed: 11/15/2010 [facility] Patient Information 2014 [place].

## 2013-10-25 NOTE — Progress Notes (Signed)
Pre visit review using our clinic review tool, if applicable. No additional management support is needed unless otherwise documented below in the visit note. 
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
## 2013-10-29 ENCOUNTER — Telehealth: Payer: Self-pay | Admitting: Internal Medicine

## 2013-10-29 MED ORDER — CLINDAMYCIN PHOSPHATE (1 DOSE) 2 % VA CREA: TOPICAL_CREAM | VAGINAL | Status: DC
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
## 2013-10-29 NOTE — Telephone Encounter (Signed)
Pt request a call from the assistant, only want to talk to the assistant, please call pt

## 2013-10-29 NOTE — Telephone Encounter (Signed)
Called pt back no answer L:MOM RTC.../lmb 

## 2013-10-31 ENCOUNTER — Telehealth: Payer: Self-pay | Admitting: *Deleted

## 2013-10-31 MED ORDER — METRONIDAZOLE 0.75 % VA GEL: 1.0000 | Freq: Two times a day (BID) | VAGINAL | Status: DC

## 2013-10-31 NOTE — Telephone Encounter (Signed)
Left msg on triage requesting md to change the cream to something else. The co-pay is too expensive cost $55.../lmb

## 2013-10-31 NOTE — Telephone Encounter (Signed)
Changed to metrogel

## 2013-11-01 NOTE — Telephone Encounter (Signed)
Notified pt with md recommendation...Joyce [name]/lmb

## 2014-03-15 ENCOUNTER — Encounter: Payer: Self-pay | Admitting: Family Medicine

## 2014-03-15 ENCOUNTER — Ambulatory Visit (INDEPENDENT_AMBULATORY_CARE_PROVIDER_SITE_OTHER): Payer: 59 | Admitting: Family Medicine

## 2014-03-15 VITALS — BP 118/78 | HR 86 | Temp 98.8°F | Ht 66.0 in | Wt 162.2 lb

## 2014-03-15 DIAGNOSIS — E860 Dehydration: Secondary | ICD-10-CM

## 2014-03-15 DIAGNOSIS — IMO0001 Reserved for inherently not codable concepts without codable children: Secondary | ICD-10-CM

## 2014-03-15 DIAGNOSIS — R002 Palpitations: Secondary | ICD-10-CM

## 2014-03-15 DIAGNOSIS — R030 Elevated blood-pressure reading, without diagnosis of hypertension: Secondary | ICD-10-CM

## 2014-03-15 DIAGNOSIS — F411 Generalized anxiety disorder: Secondary | ICD-10-CM

## 2014-03-15 HISTORY — DX: Generalized anxiety disorder: F41.1

## 2014-03-15 HISTORY — DX: Dehydration: E86.0

## 2014-03-15 HISTORY — DX: Palpitations: R00.2

## 2014-03-15 HISTORY — DX: Reserved for inherently not codable concepts without codable children: IMO0001

## 2014-03-15 NOTE — Assessment & Plan Note (Signed)
Stop caffeine, minimize sodium and monitor

## 2014-03-15 NOTE — Progress Notes (Signed)
Patient ID: Joyce [name], female   DOB: 1971-01-02, 43 y.o.   MRN: [id number] Joyce [name] [id number] 1971-01-02 03/15/2014      Progress Note-Follow Up  Subjective  Chief Complaint  Chief Complaint  Patient presents with  . bp    high bp runs in family-     HPI  Patient is a 43 year old female in today for routine medical care. She is here today with her daughter. She has been having intermittent episodes of palpitatins, feeling woozey and light headed and increased anxiety for several weeks. She notes her bp has been up to 145/80 especially when she ingests excess sodium. No recent illness. She notes episodes of feeling light head and presyncopal when her pressure is up. Denies CP/palp/SOB/congestion/fevers/GI or GU c/o. Taking meds as prescribed  Past Medical History  Diagnosis Date  . DYSURIA, CHRONIC   . GENITAL HERPES   . Headache(784.0)   . IBS   . GERD (gastroesophageal reflux disease)     Past Surgical History  Procedure Laterality Date  . Tubal ligation  2003  . Breast lumpectomy Left     Family History  Problem Relation Age of Onset  . Irritable bowel syndrome Father   . Hypertension Mother   .       History   Social History  . Marital Status: Married    Spouse Name: N/A    Number of Children: 3  . Years of Education: N/A   Occupational History  . customer service    Social History Main Topics  . Smoking status: Former Smoker    Types: Cigarettes  . Smokeless tobacco: Never Used     Comment: Only smokes when she is stress. Married, lives with spouse  . Alcohol Use: Yes     Comment: 1 glass at bedtime  . Drug Use: No  . Sexual Activity: Not on file   Other Topics Concern  . Not on file   Social History Narrative  . No narrative on file    Current Outpatient Prescriptions on File Prior to Visit  Medication Sig Dispense Refill  . bifidobacterium infantis (ALIGN) capsule Take 1 capsule by mouth daily.  30 capsule  11  . fluticasone  (FLONASE) 50 MCG/ACT nasal spray Place 2 sprays into both nostrils daily.  16 g  3  . ibuprofen (ADVIL,MOTRIN) 200 MG tablet Take 800 mg by mouth every 6 (six) hours as needed (pain).      [name]. MONONESSA 0.25-35 MG-MCG tablet Take as directed      . Multiple Vitamin (MULTIVITAMIN) tablet Take 1 tablet by mouth daily.         No current facility-administered medications on file prior to visit.    Allergies  Allergen Reactions  . Peach [Prunus Persica]   . Penicillins     REACTION: rash    Review of Systems  Review of Systems  Constitutional: Negative for fever and malaise/fatigue.  HENT: Negative for congestion.   Eyes: Negative for discharge.  Respiratory: Negative for shortness of breath.   Cardiovascular: Positive for palpitations. Negative for chest pain and leg swelling.  Gastrointestinal: Negative for nausea, abdominal pain and diarrhea.  Genitourinary: Negative for dysuria.  Musculoskeletal: Negative for falls.  Skin: Negative for rash.  Neurological: Positive for dizziness and headaches. Negative for loss of consciousness and weakness.  Endo/Heme/Allergies: Negative for polydipsia.  Psychiatric/Behavioral: Negative for depression and suicidal ideas. The patient is nervous/anxious. The patient does not have insomnia.  Objective  BP 118/78  Pulse 86  Temp(Src) 98.8 F (37.1 C) (Oral)  Ht 5\' 6"  (1.676 m)  Wt 162 lb 3.2 oz (73.573 kg)  BMI 26.19 kg/m2  SpO2 99%  Physical Exam  Physical Exam  Constitutional: She is oriented to person, place, and time and well-developed, well-nourished, and in no distress. No distress.  HENT:  Head: Normocephalic and atraumatic.  Eyes: Conjunctivae are normal.  Neck: Neck supple. No thyromegaly present.  Cardiovascular: Normal rate, regular rhythm and normal heart sounds.   No murmur heard. Pulmonary/Chest: Effort normal and breath sounds normal. She has no wheezes.  Abdominal: She exhibits no distension and no mass.   Musculoskeletal: She exhibits no edema.  Lymphadenopathy:    She has no cervical adenopathy.  Neurological: She is alert and oriented to person, place, and time.  Skin: Skin is warm and dry. No rash noted. She is not diaphoretic.  Psychiatric: Memory, affect and judgment normal.    Lab Results  Component Value Date   TSH 0.93 10/25/2013   Lab Results  Component Value Date   WBC 5.8 10/25/2013   HGB 11.9* 10/25/2013   HCT 34.7* 10/25/2013   MCV 85.8 10/25/2013   PLT 319.0 10/25/2013   Lab Results  Component Value Date   CREATININE 0.7 10/25/2013   BUN 9 10/25/2013   NA 139 10/25/2013   K 3.6 10/25/2013   CL 108 10/25/2013   CO2 28 10/25/2013   Lab Results  Component Value Date   ALT 8 10/25/2013   AST 12 10/25/2013   ALKPHOS 67 10/25/2013   BILITOT 0.3 10/25/2013   Lab Results  Component Value Date   CHOL 160 10/25/2013   Lab Results  Component Value Date   HDL 67.50 10/25/2013   Lab Results  Component Value Date   LDLCALC 80 10/25/2013   Lab Results  Component Value Date   TRIG 64.0 10/25/2013   Lab Results  Component Value Date   CHOLHDL 2 10/25/2013     Assessment & Plan  Dehydration Feeling light headed at times when not hydrating adequately. She also can skip meals and over eat carbs, encouraged small, frequent melas with lean proteins and complex carbs. Increase hydration to 64 oz of clear fluids.  Anxiety state Encouraged to minimize caffeine, exercise, minimize stress and return if worsens.  Palpitations Stop caffeine, decrease stress where possible and if palpitations persist seek care.  Elevated BP Stop caffeine, minimize sodium and monitor

## 2014-03-15 NOTE — Assessment & Plan Note (Signed)
Feeling light headed at times when not hydrating adequately. She also can skip meals and over eat carbs, encouraged small, frequent melas with lean proteins and complex carbs. Increase hydration to 64 oz of clear fluids.

## 2014-03-15 NOTE — Assessment & Plan Note (Signed)
Encouraged to minimize caffeine, exercise, minimize stress and return if worsens.

## 2014-03-15 NOTE — Assessment & Plan Note (Signed)
Stop caffeine, decrease stress where possible and if palpitations persist seek care.

## 2014-03-15 NOTE — Patient Instructions (Signed)
Zyrtec 10 mg daily Flonase to nares daily Nasal saline twice daily  No Caffeine, 64 oz of clear fluids Eat small, frequent meals with proteins, and call if no improvement, minimize sodium  Palpitations A palpitation is the feeling that your heartbeat is irregular or is faster than normal. It may feel like your heart is fluttering or skipping a beat. Palpitations are usually not a serious problem. However, in some cases, you may need further medical evaluation. CAUSES  Palpitations can be caused by:  Smoking.  Caffeine or other stimulants, such as diet pills or energy drinks.  Alcohol.  Stress and anxiety.  Strenuous physical activity.  Fatigue.  Certain medicines.  Heart disease, especially if you have a history of irregular heart rhythms (arrhythmias), such as atrial fibrillation, atrial flutter, or supraventricular tachycardia.  An improperly working pacemaker or defibrillator. DIAGNOSIS  To find the cause of your palpitations, your health care provider will take your medical history and perform a physical exam. Your health care provider may also have you take a test called an ambulatory electrocardiogram (ECG). An ECG records your heartbeat patterns over a 24-hour period. You may also have other tests, such as:  Transthoracic echocardiogram (TTE). During echocardiography, sound waves are used to evaluate how blood flows through your heart.  Transesophageal echocardiogram (TEE).  Cardiac monitoring. This allows your health care provider to monitor your heart rate and rhythm in real time.  Holter monitor. This is a portable device that records your heartbeat and can help diagnose heart arrhythmias. It allows your health care provider to track your heart activity for several days, if needed.  Stress tests by exercise or by giving medicine that makes the heart beat faster. TREATMENT  Treatment of palpitations depends on the cause of your symptoms and can vary greatly. Most  cases of palpitations do not require any treatment other than time, relaxation, and monitoring your symptoms. Other causes, such as atrial fibrillation, atrial flutter, or supraventricular tachycardia, usually require further treatment. HOME CARE INSTRUCTIONS   Avoid:  Caffeinated coffee, tea, soft drinks, diet pills, and energy drinks.  Chocolate.  Alcohol.  Stop smoking if you smoke.  Reduce your stress and anxiety. Things that can help you relax include:  A method of controlling things in your body, such as your heartbeats, with your mind (biofeedback).  Yoga.  Meditation.  Physical activity such as swimming, jogging, or walking.  Get plenty of rest and sleep. SEEK MEDICAL CARE IF:   You continue to have a fast or irregular heartbeat beyond 24 hours.  Your palpitations occur more often. SEEK IMMEDIATE MEDICAL CARE IF:  You have chest pain or shortness of breath.  You have a severe headache.  You feel dizzy or you faint. MAKE SURE YOU:  Understand these instructions.  Will watch your condition.  Will get help right away if you are not doing well or get worse. Document Released: 04/29/2000 Document Revised: 05/07/2013 Document Reviewed: 07/01/2011 [facility] Patient Information 2015 [place], [place]. This information is not intended to replace advice given to you by your health care provider. Make sure you discuss any questions you have with your health care provider. Dehydration, Adult Dehydration is when you lose more fluids from the body than you take in. Vital organs like the kidneys, brain, and heart cannot function without a proper amount of fluids and salt. Any loss of fluids from the body can cause dehydration.  CAUSES   Vomiting.  Diarrhea.  Excessive sweating.  Excessive urine output.  Fever.  SYMPTOMS  Mild dehydration  Thirst.  Dry lips.  Slightly dry mouth. Moderate dehydration  Very dry mouth.  Sunken eyes.  Skin does not bounce back  quickly when lightly pinched and released.  Dark urine and decreased urine production.  Decreased tear production.  Headache. Severe dehydration  Very dry mouth.  Extreme thirst.  Rapid, weak pulse (more than 100 beats per minute at rest).  Cold hands and feet.  Not able to sweat in spite of heat and temperature.  Rapid breathing.  Blue lips.  Confusion and lethargy.  Difficulty being awakened.  Minimal urine production.  No tears. DIAGNOSIS  Your caregiver will diagnose dehydration based on your symptoms and your exam. Blood and urine tests will help confirm the diagnosis. The diagnostic evaluation should also identify the cause of dehydration. TREATMENT  Treatment of mild or moderate dehydration can often be done at home by increasing the amount of fluids that you drink. It is best to drink small amounts of fluid more often. Drinking too much at one time can make vomiting worse. Refer to the home care instructions below. Severe dehydration needs to be treated at the hospital where you will probably be given intravenous (IV) fluids that contain water and electrolytes. HOME CARE INSTRUCTIONS   Ask your caregiver about specific rehydration instructions.  Drink enough fluids to keep your urine clear or pale yellow.  Drink small amounts frequently if you have nausea and vomiting.  Eat as you normally do.  Avoid:  Foods or drinks high in sugar.  Carbonated drinks.  Juice.  Extremely hot or cold fluids.  Drinks with caffeine.  Fatty, greasy foods.  Alcohol.  Tobacco.  Overeating.  Gelatin desserts.  Wash your hands well to avoid spreading bacteria and viruses.  Only take over-the-counter or prescription medicines for pain, discomfort, or fever as directed by your caregiver.  Ask your caregiver if you should continue all prescribed and over-the-counter medicines.  Keep all follow-up appointments with your caregiver. SEEK MEDICAL CARE IF:  You have  abdominal pain and it increases or stays in one area (localizes).  You have a rash, stiff neck, or severe headache.  You are irritable, sleepy, or difficult to awaken.  You are weak, dizzy, or extremely thirsty. SEEK IMMEDIATE MEDICAL CARE IF:   You are unable to keep fluids down or you get worse despite treatment.  You have frequent episodes of vomiting or diarrhea.  You have blood or green matter (bile) in your vomit.  You have blood in your stool or your stool looks black and tarry.  You have not urinated in 6 to 8 hours, or you have only urinated a small amount of very dark urine.  You have a fever.  You faint. MAKE SURE YOU:   Understand these instructions.  Will watch your condition.  Will get help right away if you are not doing well or get worse. Document Released: 05/02/2005 Document Revised: 07/25/2011 Document Reviewed: 12/20/2010 [facility] Patient Information 2015 [place]Care, [place]. This information is not intended to replace advice given to you by your health care provider. Make sure you discuss any questions you have with your health care provider.

## 2014-03-15 NOTE — Progress Notes (Signed)
Pre visit review using our clinic review tool, if applicable. No additional management support is needed unless otherwise documented below in the visit note. 

## 2014-04-08 ENCOUNTER — Other Ambulatory Visit: Payer: Self-pay | Admitting: Obstetrics and Gynecology

## 2014-04-11 LAB — CYTOLOGY - PAP

## 2014-04-15 ENCOUNTER — Other Ambulatory Visit: Payer: Self-pay | Admitting: Obstetrics and Gynecology

## 2014-04-15 DIAGNOSIS — R928 Other abnormal and inconclusive findings on diagnostic imaging of breast: Secondary | ICD-10-CM

## 2014-05-02 ENCOUNTER — Ambulatory Visit
Admission: RE | Admit: 2014-05-02 | Discharge: 2014-05-02 | Disposition: A | Discharge status: Home / Self Care | Payer: 59 | Source: Ambulatory Visit | Attending: Obstetrics and Gynecology | Admitting: Obstetrics and Gynecology

## 2014-05-02 DIAGNOSIS — R928 Other abnormal and inconclusive findings on diagnostic imaging of breast: Secondary | ICD-10-CM

## 2014-08-14 ENCOUNTER — Ambulatory Visit (INDEPENDENT_AMBULATORY_CARE_PROVIDER_SITE_OTHER): Payer: 59 | Admitting: Family

## 2014-08-14 ENCOUNTER — Encounter: Payer: Self-pay | Admitting: Family

## 2014-08-14 ENCOUNTER — Other Ambulatory Visit (INDEPENDENT_AMBULATORY_CARE_PROVIDER_SITE_OTHER): Payer: 59

## 2014-08-14 ENCOUNTER — Telehealth: Payer: Self-pay | Admitting: Family

## 2014-08-14 VITALS — BP 128/84 | HR 77 | Temp 98.5°F | Resp 18 | Wt 169.0 lb

## 2014-08-14 DIAGNOSIS — R42 Dizziness and giddiness: Secondary | ICD-10-CM

## 2014-08-14 LAB — COMPREHENSIVE METABOLIC PANEL
ALT: 7 U/L (ref 0–35)
AST: 11 U/L (ref 0–37)
Albumin: 3.6 g/dL (ref 3.5–5.2)
Alkaline Phosphatase: 77 U/L (ref 39–117)
BILIRUBIN TOTAL: 0.3 mg/dL (ref 0.2–1.2)
BUN: 7 mg/dL (ref 6–23)
CHLORIDE: 104 meq/L (ref 96–112)
CO2: 27 meq/L (ref 19–32)
Calcium: 8.6 mg/dL (ref 8.4–10.5)
Creatinine, Ser: 0.65 mg/dL (ref 0.40–1.20)
GFR: 128.5 mL/min (ref >60.00)
Glucose, Bld: 99 mg/dL (ref 70–99)
POTASSIUM: 4.2 meq/L (ref 3.5–5.1)
SODIUM: 135 meq/L (ref 135–145)
TOTAL PROTEIN: 7 g/dL (ref 6.0–8.3)

## 2014-08-14 LAB — HEMOGLOBIN A1C: Hgb A1c MFr Bld: 5.5 % (ref 4.6–6.5)

## 2014-08-14 NOTE — Progress Notes (Signed)
Subjective:    Patient ID: Joyce [name], female    DOB: Dec 15, 1970, 44 y.o.   MRN: [id number]  Chief Complaint  Patient presents with  . Hypertension    light headed, dizzyness, has had spouts of almost passing out, has only passed out once last year, has had headaches, x4 days    HPI:  Joyce [name] is a 44 y.o. female who presents today for an acute visit.  This is a new problem. Associated symptoms of lightheadedness and dizziness has been going on for about 4 days. She notes when driving to work yesterday it felt as though her blood pressure was increased and noted it do be 153/86 and pulse rate of 91 and then today it was 133/83 with a pulse rate of 84. Notes that on Monday she was feeling dizzy and lightheaded. Modifying factors include walking around and calming herself down. Last year she had an episode of syncope, from which she has adjusted her sodium intake. Notes that she has episodes of occasional episodes that wax and wane. Indicates that she is currently only slightly light-headed.    Allergies  Allergen Reactions  . Peach [Prunus Persica]   . Penicillins     REACTION: rash    Current Outpatient Prescriptions on File Prior to Visit  Medication Sig Dispense Refill  . bifidobacterium infantis (ALIGN) capsule Take 1 capsule by mouth daily. 30 capsule 11  . fluticasone (FLONASE) 50 MCG/ACT nasal spray Place 2 sprays into both nostrils daily. 16 g 3  . ibuprofen (ADVIL,MOTRIN) 200 MG tablet Take 800 mg by mouth every 6 (six) hours as needed (pain).    [name]. MONONESSA 0.25-35 MG-MCG tablet Take as directed    . Multiple Vitamin (MULTIVITAMIN) tablet Take 1 tablet by mouth daily.       No current facility-administered medications on file prior to visit.   Past Medical History  Diagnosis Date  . DYSURIA, CHRONIC   . GENITAL HERPES   . Headache(784.0)   . IBS   . GERD (gastroesophageal reflux disease)   . Dehydration 03/15/2014  . Anxiety state 03/15/2014  .  Palpitations 03/15/2014  . Elevated BP 03/15/2014    Review of Systems  Constitutional: Negative for fever and chills.  Respiratory: Negative for chest tightness and shortness of breath.   Cardiovascular: Negative for chest pain, palpitations and leg swelling.  Neurological: Positive for light-headedness. Negative for dizziness and headaches.      Objective:    BP 128/84 mmHg  Pulse 77  Temp(Src) 98.5 F (36.9 C) (Oral)  Resp 18  Wt 169 lb (76.658 kg)  SpO2 98% Nursing note and vital signs reviewed.  Physical Exam  Constitutional: She is oriented to person, place, and time. She appears well-developed and well-nourished. No distress.  HENT:  Right Ear: Hearing, tympanic membrane, external ear and ear canal normal.  Left Ear: Hearing, tympanic membrane, external ear and ear canal normal.  Nose: Right sinus exhibits no maxillary sinus tenderness and no frontal sinus tenderness. Left sinus exhibits no maxillary sinus tenderness and no frontal sinus tenderness.  Mouth/Throat: Uvula is midline, oropharynx is clear and moist and mucous membranes are normal.  Cardiovascular: Normal rate, regular rhythm, normal heart sounds and intact distal pulses.   Pulmonary/Chest: Effort normal and breath sounds normal.  Neurological: She is alert and oriented to person, place, and time.  Skin: Skin is warm and dry.  Psychiatric: She has a normal mood and affect. Her behavior is normal. Judgment and  thought content normal.       Assessment & Plan:

## 2014-08-14 NOTE — Progress Notes (Signed)
Pre visit review using our clinic review tool, if applicable. No additional management support is needed unless otherwise documented below in the visit note. 

## 2014-08-14 NOTE — Telephone Encounter (Signed)
Please inform the patient that her lab work shows that her A1c was 5.5 which is normal and her kidney, liver and electrolytes are all normal as well.

## 2014-08-14 NOTE — Patient Instructions (Signed)
Thank you for choosing [facility].  Summary/Instructions:   Please stop by the lab on the basement level of the building for your blood work. Your results will be released to MyChart (or called to you) after review, usually within 72 hours after test completion. If any changes need to be made, you will be notified at that same time.  If your symptoms worsen or fail to improve, please contact our office for further instruction, or in case of emergency go directly to the emergency room at the closest medical facility.   Dizziness Dizziness is a common problem. It is a feeling of unsteadiness or light-headedness. You may feel like you are about to faint. Dizziness can lead to injury if you stumble or fall. A person of any age group can suffer from dizziness, but dizziness is more common in older adults. CAUSES  Dizziness can be caused by many different things, including:  Middle ear problems.  Standing for too long.  Infections.  An allergic reaction.  Aging.  An emotional response to something, such as the sight of blood.  Side effects of medicines.  Tiredness.  Problems with circulation or blood pressure.  Excessive use of alcohol or medicines, or illegal drug use.  Breathing too fast (hyperventilation).  An irregular heart rhythm (arrhythmia).  A low red blood cell count (anemia).  Pregnancy.  Vomiting, diarrhea, fever, or other illnesses that cause body fluid loss (dehydration).  Diseases or conditions such as Parkinson's disease, high blood pressure (hypertension), diabetes, and thyroid problems.  Exposure to extreme heat. DIAGNOSIS  Your health care provider will ask about your symptoms, perform a physical exam, and perform an electrocardiogram (ECG) to record the electrical activity of your heart. Your health care provider may also perform other heart or blood tests to determine the cause of your dizziness. These may include:  Transthoracic echocardiogram  (TTE). During echocardiography, sound waves are used to evaluate how blood flows through your heart.  Transesophageal echocardiogram (TEE).  Cardiac monitoring. This allows your health care provider to monitor your heart rate and rhythm in real time.  Holter monitor. This is a portable device that records your heartbeat and can help diagnose heart arrhythmias. It allows your health care provider to track your heart activity for several days if needed.  Stress tests by exercise or by giving medicine that makes the heart beat faster. TREATMENT  Treatment of dizziness depends on the cause of your symptoms and can vary greatly. HOME CARE INSTRUCTIONS   Drink enough fluids to keep your urine clear or pale yellow. This is especially important in very hot weather. In older adults, it is also important in cold weather.  Take your medicine exactly as directed if your dizziness is caused by medicines. When taking blood pressure medicines, it is especially important to get up slowly.  Rise slowly from chairs and steady yourself until you feel okay.  In the morning, first sit up on the side of the bed. When you feel okay, stand slowly while holding onto something until you know your balance is fine.  Move your legs often if you need to stand in one place for a long time. Tighten and relax your muscles in your legs while standing.  Have someone stay with you for 1-2 days if dizziness continues to be a problem. Do this until you feel you are well enough to stay alone. Have the person call your health care provider if he or she notices changes in you that are concerning.    Do not drive or use heavy machinery if you feel dizzy.  Do not drink alcohol. SEEK IMMEDIATE MEDICAL CARE IF:   Your dizziness or light-headedness gets worse.  You feel nauseous or vomit.  You have problems talking, walking, or using your arms, hands, or legs.  You feel weak.  You are not thinking clearly or you have trouble  forming sentences. It may take a friend or family member to notice this.  You have chest pain, abdominal pain, shortness of breath, or sweating.  Your vision changes.  You notice any bleeding.  You have side effects from medicine that seems to be getting worse rather than better. MAKE SURE YOU:   Understand these instructions.  Will watch your condition.  Will get help right away if you are not doing well or get worse. Document Released: 10/26/2000 Document Revised: 05/07/2013 Document Reviewed: 11/19/2010 [employer] Patient Information 2015 [employer]. This information is not intended to replace advice given to you by your health care provider. Make sure you discuss any questions you have with your health care provider.   

## 2014-08-14 NOTE — Assessment & Plan Note (Addendum)
Patient presents with episodic lightheadedness and history of migraine headaches. In office ECG shows normal sinus rhythm. Given patient age and risk factors, low concern for cardiac origin. Question potential anxiety. Obtain CMET and A1c to rule out potential metabolic causes. Vital signs are stable. Discussed with patient potential use of Xanax, which she declined at present. Continue to drink plenty of fluids and avoid caffeine. She has a follow up appointment next week with new PCP. Monitor at this time.

## 2014-08-15 NOTE — Telephone Encounter (Signed)
LVM for pt to call back.   RE: Lab results.   Letter sent to pt address we have on file.

## 2014-08-18 NOTE — Telephone Encounter (Signed)
Patient called back and I let her know everything was normal.

## 2014-08-22 ENCOUNTER — Encounter: Payer: Self-pay | Admitting: Internal Medicine

## 2014-08-22 ENCOUNTER — Other Ambulatory Visit: Payer: 59

## 2014-08-22 ENCOUNTER — Ambulatory Visit (INDEPENDENT_AMBULATORY_CARE_PROVIDER_SITE_OTHER): Payer: 59 | Admitting: Internal Medicine

## 2014-08-22 VITALS — BP 116/78 | HR 87 | Temp 98.6°F | Resp 12 | Ht 66.0 in | Wt 168.8 lb

## 2014-08-22 DIAGNOSIS — R358 Other polyuria: Secondary | ICD-10-CM

## 2014-08-22 DIAGNOSIS — Z87891 Personal history of nicotine dependence: Secondary | ICD-10-CM

## 2014-08-22 DIAGNOSIS — B373 Candidiasis of vulva and vagina: Secondary | ICD-10-CM

## 2014-08-22 DIAGNOSIS — Z0000 Encounter for general adult medical examination without abnormal findings: Secondary | ICD-10-CM | POA: Diagnosis not present

## 2014-08-22 DIAGNOSIS — Z23 Encounter for immunization: Secondary | ICD-10-CM

## 2014-08-22 DIAGNOSIS — IMO0001 Reserved for inherently not codable concepts without codable children: Secondary | ICD-10-CM

## 2014-08-22 DIAGNOSIS — R030 Elevated blood-pressure reading, without diagnosis of hypertension: Secondary | ICD-10-CM

## 2014-08-22 DIAGNOSIS — B3731 Acute candidiasis of vulva and vagina: Secondary | ICD-10-CM

## 2014-08-22 DIAGNOSIS — B379 Candidiasis, unspecified: Secondary | ICD-10-CM

## 2014-08-22 DIAGNOSIS — K5900 Constipation, unspecified: Secondary | ICD-10-CM | POA: Diagnosis not present

## 2014-08-22 DIAGNOSIS — R3589 Other polyuria: Secondary | ICD-10-CM

## 2014-08-22 LAB — WET PREP, GENITAL
CLUE CELLS WET PREP: NONE SEEN — AB
TRICH WET PREP: NONE SEEN — AB
Yeast Wet Prep HPF POC: NONE SEEN — AB

## 2014-08-22 MED ORDER — FLUCONAZOLE 150 MG PO TABS: 150.0000 mg | ORAL_TABLET | Freq: Once | ORAL | Status: DC

## 2014-08-22 NOTE — Assessment & Plan Note (Signed)
Normal today and will continue to monitor with routine follow up.

## 2014-08-22 NOTE — Assessment & Plan Note (Signed)
Reminded to continue staying away from cigarettes and she has no intention of resuming. Reminded her of the risks and harms of cigarette smoke.

## 2014-08-22 NOTE — Assessment & Plan Note (Signed)
This is likely due to drinking a lot of water and advised her that she can just drink until she has had about 4-6 glasses of fluid per day for her health and not 2-3 of 32 oz containers as she had been doing.

## 2014-08-22 NOTE — Assessment & Plan Note (Signed)
She does try to manage this with natural fiber and probiotics. She also has been trying to drink some more water which will be helpful.

## 2014-08-22 NOTE — Assessment & Plan Note (Signed)
Tdap given today, did not take the flu shot this season. Up to date on pap smear. She does follow with gynecology and they handle her mammograms which have been normal. Labs checked recently reviewed are normal. Cholesterol checked within last year and normal off medications. Weights stable so will check again next year.

## 2014-08-22 NOTE — Progress Notes (Signed)
   Subjective:    Patient ID: Joyce [name], female    DOB: Nov 03, 1970, 44 y.o.   MRN: [id number]  HPI The patient is a 44 YO female coming in for wellness. She thinks she may have a yeast infection and gets them occasionally. She is not sure what caused it. No new problems and her blood pressure is doing well (she checks it at lunchtime).   PMH, FHM, social history reviewed and updated.   Review of Systems  Constitutional: Negative for fever, activity change, appetite change, fatigue and unexpected weight change.  HENT: Negative.   Eyes: Negative.   Respiratory: Negative for cough, chest tightness and wheezing.   Cardiovascular: Negative for chest pain, palpitations and leg swelling.  Gastrointestinal: Positive for constipation. Negative for abdominal pain, diarrhea and abdominal distention.  Genitourinary: Positive for vaginal discharge.  Musculoskeletal: Negative.   Skin: Negative.   Neurological: Negative.       Objective:   Physical Exam  Constitutional: She is oriented to person, place, and time. She appears well-developed and well-nourished.  HENT:  Head: Normocephalic and atraumatic.  Eyes: EOM are normal.  Neck: Normal range of motion.  Cardiovascular: Normal rate and regular rhythm.   Pulmonary/Chest: Effort normal and breath sounds normal.  Abdominal: Soft. She exhibits no distension. There is no tenderness.  Genitourinary:  External genitalia normal appearing, no regional adenopathy, mild white discharge in the vagina, sample sent to the lab for testing  Musculoskeletal: She exhibits no edema.  Neurological: She is alert and oriented to person, place, and time. Coordination normal.  Skin: Skin is warm and dry.  Psychiatric: She has a normal mood and affect.   Filed Vitals:   08/22/14 1528  BP: 116/78  Pulse: 87  Temp: 98.6 F (37 C)  TempSrc: Oral  Resp: 12  Height: 5\' 6"  (1.676 m)  Weight: 168 lb 12.8 oz (76.567 kg)  SpO2: 96%      Assessment & Plan:    Tdap given today

## 2014-08-22 NOTE — Assessment & Plan Note (Signed)
Likely recurrent today, no recent antibiotics although she is not taking her probiotics right now. Will send for wet prep to rule out BV as well. No new sexual partners.

## 2014-08-22 NOTE — Progress Notes (Signed)
Pre visit review using our clinic review tool, if applicable. No additional management support is needed unless otherwise documented below in the visit note. 

## 2014-08-22 NOTE — Patient Instructions (Addendum)
We think that you likely have a yeast infection but we will send the test to check for bacteria. We have sent in a medicine called diflucan which is a pill that you take 1 time to get rid of the yeast infection.   We will not check blood work today but will give you the tetanus shot.   Health Maintenance Adopting a healthy lifestyle and getting preventive care can go a long way to promote health and wellness. Talk with your health care provider about what schedule of regular examinations is right for you. This is a good chance for you to check in with your provider about disease prevention and staying healthy. In between checkups, there are plenty of things you can do on your own. Experts have done a lot of research about which lifestyle changes and preventive measures are most likely to keep you healthy. Ask your health care provider for more information. WEIGHT AND DIET  Eat a healthy diet  Be sure to include plenty of vegetables, fruits, low-fat dairy products, and lean protein.  Do not eat a lot of foods high in solid fats, added sugars, or salt.  Get regular exercise. This is one of the most important things you can do for your health.  Most adults should exercise for at least 150 minutes each week. The exercise should increase your heart rate and make you sweat (moderate-intensity exercise).  Most adults should also do strengthening exercises at least twice a week. This is in addition to the moderate-intensity exercise.  Maintain a healthy weight  Body mass index (BMI) is a measurement that can be used to identify possible weight problems. It estimates body fat based on height and weight. Your health care provider can help determine your BMI and help you achieve or maintain a healthy weight.  For females 58 years of age and older:   A BMI below 18.5 is considered underweight.  A BMI of 18.5 to 24.9 is normal.  A BMI of 25 to 29.9 is considered overweight.  A BMI of 30 and above  is considered obese.  Watch levels of cholesterol and blood lipids  You should start having your blood tested for lipids and cholesterol at 42 years of age, then have this test every 5 years.  You may need to have your cholesterol levels checked more often if:  Your lipid or cholesterol levels are high.  You are older than 42 years of age.  You are at high risk for heart disease.  CANCER SCREENING   Lung Cancer  Lung cancer screening is recommended for adults 52-15 years old who are at high risk for lung cancer because of a history of smoking.  A yearly low-dose CT scan of the lungs is recommended for people who:  Currently smoke.  Have quit within the past 15 years.  Have at least a 30-pack-year history of smoking. A pack year is smoking an average of one pack of cigarettes a day for 1 year.  Yearly screening should continue until it has been 15 years since you quit.  Yearly screening should stop if you develop a health problem that would prevent you from having lung cancer treatment.  Breast Cancer  Practice breast self-awareness. This means understanding how your breasts normally appear and feel.  It also means doing regular breast self-exams. Let your health care provider know about any changes, no matter how small.  If you are in your 20s or 30s, you should have a clinical breast  exam (CBE) by a health care provider every 1-3 years as part of a regular health exam.  If you are 64 or older, have a CBE every year. Also consider having a breast X-ray (mammogram) every year.  If you have a family history of breast cancer, talk to your health care provider about genetic screening.  If you are at high risk for breast cancer, talk to your health care provider about having an MRI and a mammogram every year.  Breast cancer gene (BRCA) assessment is recommended for women who have family members with BRCA-related cancers. BRCA-related cancers  include:  Breast.  Ovarian.  Tubal.  Peritoneal cancers.  Results of the assessment will determine the need for genetic counseling and BRCA1 and BRCA2 testing. Cervical Cancer Routine pelvic examinations to screen for cervical cancer are no longer recommended for nonpregnant women who are considered low risk for cancer of the pelvic organs (ovaries, uterus, and vagina) and who do not have symptoms. A pelvic examination may be necessary if you have symptoms including those associated with pelvic infections. Ask your health care provider if a screening pelvic exam is right for you.   The Pap test is the screening test for cervical cancer for women who are considered at risk.  If you had a hysterectomy for a problem that was not cancer or a condition that could lead to cancer, then you no longer need Pap tests.  If you are older than 65 years, and you have had normal Pap tests for the past 10 years, you no longer need to have Pap tests.  If you have had past treatment for cervical cancer or a condition that could lead to cancer, you need Pap tests and screening for cancer for at least 20 years after your treatment.  If you no longer get a Pap test, assess your risk factors if they change (such as having a new sexual partner). This can affect whether you should start being screened again.  Some women have medical problems that increase their chance of getting cervical cancer. If this is the case for you, your health care provider may recommend more frequent screening and Pap tests.  The human papillomavirus (HPV) test is another test that may be used for cervical cancer screening. The HPV test looks for the virus that can cause cell changes in the cervix. The cells collected during the Pap test can be tested for HPV.  The HPV test can be used to screen women 63 years of age and older. Getting tested for HPV can extend the interval between normal Pap tests from three to five years.  An HPV  test also should be used to screen women of any age who have unclear Pap test results.  After 42 years of age, women should have HPV testing as often as Pap tests.  Colorectal Cancer  This type of cancer can be detected and often prevented.  Routine colorectal cancer screening usually begins at 42 years of age and continues through 42 years of age.  Your health care provider may recommend screening at an earlier age if you have risk factors for colon cancer.  Your health care provider may also recommend using home test kits to check for hidden blood in the stool.  A small camera at the end of a tube can be used to examine your colon directly (sigmoidoscopy or colonoscopy). This is done to check for the earliest forms of colorectal cancer.  Routine screening usually begins at age 91.  Direct examination of the colon should be repeated every 5-10 years through 42 years of age. However, you may need to be screened more often if early forms of precancerous polyps or small growths are found. Skin Cancer  Check your skin from head to toe regularly.  Tell your health care provider about any new moles or changes in moles, especially if there is a change in a mole's shape or color.  Also tell your health care provider if you have a mole that is larger than the size of a pencil eraser.  Always use sunscreen. Apply sunscreen liberally and repeatedly throughout the day.  Protect yourself by wearing long sleeves, pants, a wide-brimmed hat, and sunglasses whenever you are outside. HEART DISEASE, DIABETES, AND HIGH BLOOD PRESSURE   Have your blood pressure checked at least every 1-2 years. High blood pressure causes heart disease and increases the risk of stroke.  If you are between 83 years and 38 years old, ask your health care provider if you should take aspirin to prevent strokes.  Have regular diabetes screenings. This involves taking a blood sample to check your fasting blood sugar  level.  If you are at a normal weight and have a low risk for diabetes, have this test once every three years after 42 years of age.  If you are overweight and have a high risk for diabetes, consider being tested at a younger age or more often. PREVENTING INFECTION  Hepatitis B  If you have a higher risk for hepatitis B, you should be screened for this virus. You are considered at high risk for hepatitis B if:  You were born in a country where hepatitis B is common. Ask your health care provider which countries are considered high risk.  Your parents were born in a high-risk country, and you have not been immunized against hepatitis B (hepatitis B vaccine).  You have HIV or AIDS.  You use needles to inject street drugs.  You live with someone who has hepatitis B.  You have had sex with someone who has hepatitis B.  You get hemodialysis treatment.  You take certain medicines for conditions, including cancer, organ transplantation, and autoimmune conditions. Hepatitis C  Blood testing is recommended for:  Everyone born from [birth year] through 1963.  Anyone with known risk factors for hepatitis C. Sexually transmitted infections (STIs)  You should be screened for sexually transmitted infections (STIs) including gonorrhea and chlamydia if:  You are sexually active and are younger than 42 years of age.  You are older than 42 years of age and your health care provider tells you that you are at risk for this type of infection.  Your sexual activity has changed since you were last screened and you are at an increased risk for chlamydia or gonorrhea. Ask your health care provider if you are at risk.  If you do not have HIV, but are at risk, it may be recommended that you take a prescription medicine daily to prevent HIV infection. This is called pre-exposure prophylaxis (PrEP). You are considered at risk if:  You are sexually active and do not regularly use condoms or know the HIV status  of your partner(s).  You take drugs by injection.  You are sexually active with a partner who has HIV. Talk with your health care provider about whether you are at high risk of being infected with HIV. If you choose to begin PrEP, you should first be tested for HIV. You should then be  tested every 3 months for as long as you are taking PrEP.  PREGNANCY   If you are premenopausal and you may become pregnant, ask your health care provider about preconception counseling.  If you may become pregnant, take 400 to 800 micrograms (mcg) of folic acid every day.  If you want to prevent pregnancy, talk to your health care provider about birth control (contraception). OSTEOPOROSIS AND MENOPAUSE   Osteoporosis is a disease in which the bones lose minerals and strength with aging. This can result in serious bone fractures. Your risk for osteoporosis can be identified using a bone density scan.  If you are 65 years of age or older, or if you are at risk for osteoporosis and fractures, ask your health care provider if you should be screened.  Ask your health care provider whether you should take a calcium or vitamin D supplement to lower your risk for osteoporosis.  Menopause may have certain physical symptoms and risks.  Hormone replacement therapy may reduce some of these symptoms and risks. Talk to your health care provider about whether hormone replacement therapy is right for you.  HOME CARE INSTRUCTIONS   Schedule regular health, dental, and eye exams.  Stay current with your immunizations.   Do not use any tobacco products including cigarettes, chewing tobacco, or electronic cigarettes.  If you are pregnant, do not drink alcohol.  If you are breastfeeding, limit how much and how often you drink alcohol.  Limit alcohol intake to no more than 1 drink per day for nonpregnant women. One drink equals 12 ounces of beer, 5 ounces of wine, or 1 ounces of hard liquor.  Do not use street  drugs.  Do not share needles.  Ask your health care provider for help if you need support or information about quitting drugs.  Tell your health care provider if you often feel depressed.  Tell your health care provider if you have ever been abused or do not feel safe at home. Document Released: 11/15/2010 Document Revised: 09/16/2013 Document Reviewed: 04/03/2013 [employer] Patient Information 2015 [employer]. This information is not intended to replace advice given to you by your health care provider. Make sure you discuss any questions you have with your health care provider.  

## 2014-09-05 ENCOUNTER — Telehealth: Payer: Self-pay | Admitting: Internal Medicine

## 2014-09-05 NOTE — Telephone Encounter (Signed)
Left message advising pt that she has not had urinalysis since June, 2015 with us and oxybutynin is not current med on her list---without this i cannot refill until i get dr Loann Quillkollars approval---advised pt to call back if she cannot wait until dr Dorise Hisskollar returns on Tuesday 4/26

## 2014-09-05 NOTE — Telephone Encounter (Signed)
Pt called in and said that she mean to ask Dr Dorise HissKollar if she could refill her Oxubutin.  This is a new medication and forgot to ask here when she was hear last week.  Told her that Dr Dorise Hisskollar would be back tues and i would have nurse give her a call    Best number  [phone]

## 2014-09-09 MED ORDER — OXYBUTYNIN CHLORIDE ER 10 MG PO TB24
10.0000 mg | ORAL_TABLET | Freq: Every day | ORAL | Status: DC
Start: 2014-09-09 — End: 2018-11-26

## 2014-09-09 NOTE — Telephone Encounter (Signed)
Advised pt that new rx ditropan has been called in and if doesn't work to call back to make office visit per dr Dorise Hisskollar

## 2014-09-09 NOTE — Telephone Encounter (Signed)
Have called in the long acting once a day formulation. She will take at night time and if not working needs visit for evaluation.

## 2014-09-25 ENCOUNTER — Ambulatory Visit: Payer: 59 | Admitting: Internal Medicine

## 2014-09-26 ENCOUNTER — Ambulatory Visit (INDEPENDENT_AMBULATORY_CARE_PROVIDER_SITE_OTHER): Payer: 59 | Admitting: Internal Medicine

## 2014-09-26 ENCOUNTER — Encounter: Payer: Self-pay | Admitting: Internal Medicine

## 2014-09-26 VITALS — BP 122/80 | HR 98 | Temp 98.7°F | Ht 66.0 in | Wt 167.8 lb

## 2014-09-26 DIAGNOSIS — R21 Rash and other nonspecific skin eruption: Secondary | ICD-10-CM | POA: Diagnosis not present

## 2014-09-26 MED ORDER — TRIAMCINOLONE ACETONIDE 0.1 % EX CREA: 1.0000 "application " | TOPICAL_CREAM | Freq: Two times a day (BID) | CUTANEOUS | Status: DC

## 2014-09-26 NOTE — Patient Instructions (Signed)
Please take all new medication as prescribed - the cream  Please continue all other medications as before, and refills have been done if requested.  Please have the pharmacy call with any other refills you may need.  Please keep your appointments with your specialists as you may have planned   

## 2014-09-26 NOTE — Progress Notes (Signed)
   Subjective:    Patient ID: Joyce [name], female    DOB: [birth year], 44 y.o.   MRN: [id number]  HPI  Here with acute onset rash to right face x 3 days, small red itchy bumps mostly to the right face below the eye for some reason, very few noted on the left.  No pain, fever, lip or tongue swelling, throat pain, HA, ST, cough and Pt denies chest pain, increased sob or doe, wheezing, orthopnea, PND, increased LE swelling, palpitations, dizziness or syncope.  Has stopped using a particular brush and makeup and may be some improved today Past Medical History  Diagnosis Date  . DYSURIA, CHRONIC   . GENITAL HERPES   . Headache(784.0)   . IBS   . GERD (gastroesophageal reflux disease)   . Dehydration 03/15/2014  . Anxiety state 03/15/2014  . Palpitations 03/15/2014  . Elevated BP 03/15/2014   Past Surgical History  Procedure Laterality Date  . Tubal ligation  2003  . Breast lumpectomy Left     reports that she has quit smoking. Her smoking use included Cigarettes. She has never used smokeless tobacco. She reports that she drinks alcohol. She reports that she does not use illicit drugs. family history includes Hypertension in her mother; Irritable bowel syndrome in her father. Allergies  Allergen Reactions  . Peach [Prunus Persica]   . Penicillins     REACTION: rash   Current Outpatient Prescriptions on File Prior to Visit  Medication Sig Dispense Refill  . bifidobacterium infantis (ALIGN) capsule Take 1 capsule by mouth daily. 30 capsule 11  . fluconazole (DIFLUCAN) 150 MG tablet Take 1 tablet (150 mg total) by mouth once. 1 tablet 0  . fluticasone (FLONASE) 50 MCG/ACT nasal spray Place 2 sprays into both nostrils daily. 16 g 3  . ibuprofen (ADVIL,MOTRIN) 200 MG tablet Take 800 mg by mouth every 6 (six) hours as needed (pain).    [name]. MONONESSA 0.25-35 MG-MCG tablet Take as directed    . Multiple Vitamin (MULTIVITAMIN) tablet Take 1 tablet by mouth daily.      Marland Kitchen. oxybutynin (DITROPAN-XL)  10 MG 24 hr tablet Take 1 tablet (10 mg total) by mouth at bedtime. 30 tablet 3   No current facility-administered medications on file prior to visit.   Review of Systems All otherwise neg per pt     Objective:   Physical Exam BP 122/80 mmHg  Pulse 98  Temp(Src) 98.7 F (37.1 C) (Oral)  Ht 5\' 6"  (1.676 m)  Wt 167 lb 12 oz (76.091 kg)  BMI 27.09 kg/m2  SpO2 98% VS noted,  Constitutional: Pt appears in no significant distress HENT: Head: NCAT.  Right Ear: External ear normal.  Left Ear: External ear normal.  Eyes: . Pupils are equal, round, and reactive to light. Conjunctivae and EOM are normal Neck: Normal range of motion. Neck supple.  Cardiovascular: Normal rate and regular rhythm.   Pulmonary/Chest: Effort normal and breath sounds without rales or wheezing.  Abd:  Soft, NT, ND, + BS Neurological: Pt is alert. Not confused , motor grossly intact Skin: Skin is warm. , no LE edema, right face below eye with numerous small < 5 mm slight raised nontender erythem lesions nonvesicular nonulcerated, few noted on left, also few just medial to right eye at bridge of nose Psychiatric: Pt behavior is normal. No agitation.     Assessment & Plan:

## 2014-09-26 NOTE — Assessment & Plan Note (Signed)
I suspect prob contact dermatitis, ok for triam cr prn,  to f/u any worsening symptoms or concerns, try to avoid any contact allergens

## 2014-09-26 NOTE — Progress Notes (Signed)
Pre visit review using our clinic review tool, if applicable. No additional management support is needed unless otherwise documented below in the visit note. 

## 2015-01-16 ENCOUNTER — Ambulatory Visit (INDEPENDENT_AMBULATORY_CARE_PROVIDER_SITE_OTHER): Payer: 59 | Admitting: Internal Medicine

## 2015-01-16 ENCOUNTER — Encounter: Payer: Self-pay | Admitting: Internal Medicine

## 2015-01-16 VITALS — BP 136/88 | HR 84 | Temp 98.3°F | Resp 16 | Wt 168.0 lb

## 2015-01-16 DIAGNOSIS — L02415 Cutaneous abscess of right lower limb: Secondary | ICD-10-CM | POA: Diagnosis not present

## 2015-01-16 MED ORDER — DOXYCYCLINE HYCLATE 100 MG PO TABS: 100.0000 mg | ORAL_TABLET | Freq: Two times a day (BID) | ORAL | Status: DC

## 2015-01-16 MED ORDER — MUPIROCIN 2 % EX OINT: TOPICAL_OINTMENT | CUTANEOUS | Status: DC

## 2015-01-16 NOTE — Progress Notes (Signed)
   Subjective:    Patient ID: Joyce [name], female    DOB: [birth year], 44 y.o.   MRN: [id number]  HPI  She was bitten by an unknown vector 01/12/15 approximately 6-7 PM. It was itching so she applied hydrocortisone. On 8/30 she noted some pustule formation. She heated  a needle and opened it. She also applied baking soda.She is taking Benadryl for the itching.  She has had some abdominal pain this is a recurrent issue for her related to her irritable bowel.  Review of Systems  No associated itchy, watery eyes.  Swelling of the lips or tongue or intraoral lesions denied.  Shortness of breath, wheezing, or cough absent.  No urticaria noted.  Fever ,chills , or sweats denied.   Diarrhea not present.  No dysuria, pyuria or hematuria.    Objective:   Physical Exam Pertinent or positive findings include: There is a 12 x 7 mm flattened pustule over the right medial ankle area. Pes planus is present. Pedal pulses are excellent.   General appearance :adequately nourished; in no distress.  Eyes: No conjunctival inflammation or scleral icterus is present.  Oral exam:  Lips and gums are healthy appearing.There is no oropharyngeal erythema or exudate noted. Dental hygiene is good.  Heart:  Normal rate and regular rhythm. S1 and S2 normal without gallop, murmur, click, rub or other extra sounds    Lungs:Chest clear to auscultation; no wheezes, rhonchi,rales ,or rubs present.No increased work of breathing.   Abdomen: bowel sounds normal, soft and non-tender without masses, organomegaly or hernias noted.  No guarding or rebound.   Vascular : all pulses equal ; no bruits present.  Skin:Warm & dry.  Intact without suspicious lesions or rashes ; no tenting  Lymphatic: No lymphadenopathy is noted about the head, neck, axilla.   Neuro: Strength, tone  normal.      Assessment & Plan:  #1 skin abscess; most likely etiology was mosquito bite  See orders recommendation

## 2015-01-16 NOTE — Patient Instructions (Addendum)
Dip gauze in  sterile saline and applied to the wound twice a day. Cover the wound with Telfa , non stick dressing  with antibiotic ointment. The saline can be purchased at the drugstore or you can make your own .Boil cup of salt in a gallon of water. Store mixture  in a clean container. Report Warning  signs as discussed (red streaks, pus, fever, increasing pain).Please report warning signs as we discussed. Worrisome would be red streaks up the extremity, increased pain, fever, or pus production.

## 2015-01-16 NOTE — Progress Notes (Signed)
Pre visit review using our clinic review tool, if applicable. No additional management support is needed unless otherwise documented below in the visit note. 

## 2015-03-09 ENCOUNTER — Other Ambulatory Visit: Payer: Self-pay

## 2015-03-09 DIAGNOSIS — Z1231 Encounter for screening mammogram for malignant neoplasm of breast: Secondary | ICD-10-CM

## 2015-04-23 ENCOUNTER — Ambulatory Visit: Payer: 59 | Admitting: Internal Medicine

## 2015-05-01 ENCOUNTER — Other Ambulatory Visit: Payer: Self-pay | Admitting: Obstetrics & Gynecology

## 2015-05-08 ENCOUNTER — Ambulatory Visit
Admission: RE | Admit: 2015-05-08 | Discharge: 2015-05-08 | Disposition: A | Discharge status: Home / Self Care | Payer: 59 | Source: Ambulatory Visit

## 2015-05-08 DIAGNOSIS — Z1231 Encounter for screening mammogram for malignant neoplasm of breast: Secondary | ICD-10-CM

## 2015-05-12 ENCOUNTER — Ambulatory Visit: Payer: 59 | Admitting: Internal Medicine

## 2015-05-27 ENCOUNTER — Encounter: Payer: Self-pay | Admitting: Internal Medicine

## 2015-05-27 ENCOUNTER — Ambulatory Visit: Payer: 59 | Admitting: Internal Medicine

## 2015-05-27 ENCOUNTER — Ambulatory Visit (INDEPENDENT_AMBULATORY_CARE_PROVIDER_SITE_OTHER): Payer: 59 | Admitting: Internal Medicine

## 2015-05-27 VITALS — BP 116/78 | HR 97 | Temp 98.6°F | Ht 66.0 in | Wt 171.0 lb

## 2015-05-27 DIAGNOSIS — J069 Acute upper respiratory infection, unspecified: Secondary | ICD-10-CM | POA: Diagnosis not present

## 2015-05-27 MED ORDER — LEVOFLOXACIN 500 MG PO TABS: 500.0000 mg | ORAL_TABLET | Freq: Every day | ORAL | Status: DC

## 2015-05-27 MED ORDER — HYDROCODONE-HOMATROPINE 5-1.5 MG/5ML PO SYRP: 5.0000 mL | ORAL_SOLUTION | Freq: Four times a day (QID) | ORAL | Status: DC | PRN

## 2015-05-27 NOTE — Progress Notes (Signed)
Pre visit review using our clinic review tool, if applicable. No additional management support is needed unless otherwise documented below in the visit note. 

## 2015-05-27 NOTE — Progress Notes (Signed)
Subjective:    Patient ID: Joyce [name], female    DOB: 01/31/1971, 45 y.o.   MRN: [id number]  HPI   Here with 2-3 days acute onset fever, facial pain, pressure, headache, general weakness and malaise, and greenish d/c, with mild ST and cough, but pt denies chest pain, wheezing, increased sob or doe, orthopnea, PND, increased LE swelling, palpitations, dizziness or syncope. Past Medical History  Diagnosis Date  . DYSURIA, CHRONIC   . GENITAL HERPES   . Headache(784.0)   . IBS   . GERD (gastroesophageal reflux disease)   . Dehydration 03/15/2014  . Anxiety state 03/15/2014  . Palpitations 03/15/2014  . Elevated BP 03/15/2014   Past Surgical History  Procedure Laterality Date  . Tubal ligation  2003  . Breast lumpectomy Left     reports that she has quit smoking. Her smoking use included Cigarettes. She has never used smokeless tobacco. She reports that she drinks alcohol. She reports that she does not use illicit drugs. family history includes Hypertension in her mother; Irritable bowel syndrome in her father. Allergies  Allergen Reactions  . Peach [Prunus Persica]   . Penicillins     REACTION: rash   Current Outpatient Prescriptions on File Prior to Visit  Medication Sig Dispense Refill  . bifidobacterium infantis (ALIGN) capsule Take 1 capsule by mouth daily. 30 capsule 11  . ibuprofen (ADVIL,MOTRIN) 200 MG tablet Take 800 mg by mouth every 6 (six) hours as needed (pain).    [name]. MONONESSA 0.25-35 MG-MCG tablet Take as directed    . Multiple Vitamin (MULTIVITAMIN) tablet Take 1 tablet by mouth daily.      Marland Kitchen. doxycycline (VIBRA-TABS) 100 MG tablet Take 1 tablet (100 mg total) by mouth 2 (two) times daily. (Patient not taking: Reported on 05/27/2015) 14 tablet 0  . fluconazole (DIFLUCAN) 150 MG tablet Take 1 tablet (150 mg total) by mouth once. (Patient not taking: Reported on 05/27/2015) 1 tablet 0  . fluticasone (FLONASE) 50 MCG/ACT nasal spray Place 2 sprays into both nostrils  daily. (Patient not taking: Reported on 05/27/2015) 16 g 3  . mupirocin ointment (BACTROBAN) 2 % Applied twice a day to the affected area;NOT into eyes. (Patient not taking: Reported on 05/27/2015) 22 g 0  . oxybutynin (DITROPAN-XL) 10 MG 24 hr tablet Take 1 tablet (10 mg total) by mouth at bedtime. (Patient not taking: Reported on 05/27/2015) 30 tablet 3  . triamcinolone cream (KENALOG) 0.1 % Apply 1 application topically 2 (two) times daily. (Patient not taking: Reported on 05/27/2015) 30 g 0   No current facility-administered medications on file prior to visit.   Review of Systems All otherwise neg per pt     Objective:   Physical Exam BP 116/78 mmHg  Pulse 97  Temp(Src) 98.6 F (37 C) (Oral)  Ht 5\' 6"  (1.676 m)  Wt 171 lb (77.565 kg)  BMI 27.61 kg/m2  SpO2 98%  LMP 05/24/2015 VS noted,  Constitutional: Pt appears in no significant distress HENT: Head: NCAT.  Right Ear: External ear normal.  Left Ear: External ear normal.  Eyes: . Pupils are equal, round, and reactive to light. Conjunctivae and EOM are normal Bilat tm's with mild erythema.  Max sinus areas mild tender.  Pharynx with mild erythema, no exudate Neck: Normal range of motion. Neck supple.  Cardiovascular: Normal rate and regular rhythm.   Pulmonary/Chest: Effort normal and breath sounds without rales or wheezing.  Neurological: Pt is alert. Not confused , motor grossly intact  Skin: Skin is warm. No rash, no LE edema Psychiatric: Pt behavior is normal. No agitation.      Assessment & Plan:

## 2015-05-27 NOTE — Patient Instructions (Signed)
Please take all new medication as prescribed - the antibiotic, and cough medicine if needed  You can also take Mucinex or Mucinex D (or it's generic off brand) for congestion, and tylenol as needed for pain.  Please continue all other medications as before, and refills have been done if requested.  Please have the pharmacy call with any other refills you may need.  Please keep your appointments with your specialists as you may have planned

## 2015-05-28 ENCOUNTER — Telehealth: Payer: Self-pay | Admitting: Internal Medicine

## 2015-05-28 NOTE — Telephone Encounter (Signed)
Patient seen Dr. Jonny RuizJohn yesterday.  Patient would like to know when she can go back to work and if she is contagious.  She also has some other concerns.  Can you please follow back up with patient.

## 2015-05-29 NOTE — Telephone Encounter (Signed)
Pt advised: 24-48 hrs after starting ABX via VM

## 2015-05-30 NOTE — Assessment & Plan Note (Signed)
Mild to mod, for antibx course,  to f/u any worsening symptoms or concerns 
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
## 2015-07-03 ENCOUNTER — Ambulatory Visit (INDEPENDENT_AMBULATORY_CARE_PROVIDER_SITE_OTHER): Payer: 59 | Admitting: Internal Medicine

## 2015-07-03 ENCOUNTER — Encounter: Payer: Self-pay | Admitting: Internal Medicine

## 2015-07-03 VITALS — BP 112/68 | HR 72 | Ht 66.0 in | Wt 170.8 lb

## 2015-07-03 DIAGNOSIS — R140 Abdominal distension (gaseous): Secondary | ICD-10-CM | POA: Diagnosis not present

## 2015-07-03 DIAGNOSIS — K625 Hemorrhage of anus and rectum: Secondary | ICD-10-CM | POA: Diagnosis not present

## 2015-07-03 DIAGNOSIS — K581 Irritable bowel syndrome with constipation: Secondary | ICD-10-CM | POA: Diagnosis not present

## 2015-07-03 DIAGNOSIS — K5909 Other constipation: Secondary | ICD-10-CM | POA: Diagnosis not present

## 2015-07-03 NOTE — Patient Instructions (Signed)
Try the Linzess that you have at home.  If it works, call us and I'll send you a prescription

## 2015-07-03 NOTE — Progress Notes (Signed)
HISTORY OF PRESENT ILLNESS:  Joyce [name] is a 45 y.o. female with constipation predominant IBS who presents today regarding constipation and abdominal bloating discomfort. She is accompanied by her daughter. Previous patient of Dr. Jarold Motto. Established with me January 2015. 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 Patient has not tolerated Metamucil or Amitiza. Use MiraLAX sporadically. He was prescribed linzess at her last visit. Took for a few days but stopped secondary to noises in her bowels. Unclear if it helped. She is currently using over-the-counter supplements with cascara or senna. These help. She will go up to one week without a bowel movement. Progressive abdominal discomfort and bloating without bowel movements. Continues with minor rectal bleeding as previous. Some minor discomfort. Over-the-counter ointments help. She has multiple questions regarding MiraLAX, possibility of trying linzess again, and other agents. Really, no new problems   REVIEW OF SYSTEMS:  All non-GI ROS negative except for sinus and allergy, back pain, sleeping problems  Past Medical History  Diagnosis Date  . DYSURIA, CHRONIC   . GENITAL HERPES   . Headache(784.0)   . IBS   . GERD (gastroesophageal reflux disease)   . Dehydration 03/15/2014  . Anxiety state 03/15/2014  . Palpitations 03/15/2014  . Elevated BP 03/15/2014    Past Surgical History  Procedure Laterality Date  . Tubal ligation  2003  . Breast lumpectomy Left     Social History Joyce [name]  reports that she has quit smoking. Her smoking use included Cigarettes. She has never used smokeless tobacco. She reports that she drinks alcohol. She reports that she does not use illicit drugs.  family history includes Hypertension in her mother; Irritable bowel syndrome in her father.  Allergies  Allergen Reactions  . Peach [Prunus Persica]   .  Penicillins     REACTION: rash       PHYSICAL EXAMINATION: Vital signs: BP 112/68 mmHg  Pulse 72  Ht  (1.676 m)  Wt 170 lb 12.8 oz (77.474 kg)  BMI 27.58 kg/m2  Constitutional: generally well-appearing, no acute distress Psychiatric: alert and oriented x3, cooperative Eyes: extraocular movements intact, anicteric, conjunctiva pink Mouth: oral pharynx moist, no lesions Neck: supple no lymphadenopathy Cardiovascular: heart regular rate and rhythm, no murmur Lungs: clear to auscultation bilaterally Abdomen: soft, nontender, nondistended, no obvious ascites, no peritoneal signs, normal bowel sounds, no organomegaly Rectal: Ommitted Extremities: no clubbing cyanosis or lower extremity edema bilaterally Skin: no lesions on visible extremities Neuro: No focal deficits. Normal DTRs  ASSESSMENT:  #1. Constipation predominant IBS. Ongoing #2. Minor rectal bleeding secondary to anorectal pathology (benign)   PLAN:  #1. Discussed proper way to titrate MiraLAX. Recommended #2. Okay to retry linzess. We will prescribe if she finds this helpful #3. Long discussion today regarding constipation, and IBS   25 minutes was spent face-to-face with the patient. Greater than 50% the time use for counseling regarding her constipation, bloating, and abdominal discomfort

## 2015-07-07 ENCOUNTER — Telehealth: Payer: Self-pay

## 2015-07-07 ENCOUNTER — Other Ambulatory Visit: Payer: Self-pay

## 2015-07-07 MED ORDER — LINACLOTIDE 290 MCG PO CAPS: 290.0000 ug | ORAL_CAPSULE | Freq: Every day | ORAL | Status: DC

## 2015-07-07 NOTE — Telephone Encounter (Signed)
Patient called and stated that the Linzess at home she planned to try to see if it worked was expired.  I contacted the rep to bring a few samples that I can give to patient.  Then if it works for her, I will send in a rx.  Patient agreed

## 2015-07-08 ENCOUNTER — Telehealth: Payer: Self-pay

## 2015-07-08 NOTE — Telephone Encounter (Signed)
Lm for patient that we were able to get her 30 days of Linzess free, which was waiting at her pharmacy.  In the meantime I will work on the prior authorization to get the discounted price going forward.

## 2015-07-13 NOTE — Telephone Encounter (Signed)
Submitted prior authorization to [employer].  Awaiting response

## 2015-07-17 MED ORDER — LINACLOTIDE 290 MCG PO CAPS: 290.0000 ug | ORAL_CAPSULE | Freq: Every day | ORAL | Status: DC

## 2015-07-17 NOTE — Telephone Encounter (Signed)
Activated Linzess copay card.  Called in new rx. Kennedy BuckerGrant will call pharmacy with copay card number for them to run

## 2015-07-21 NOTE — Telephone Encounter (Signed)
Left message with patient that, per Kennedy BuckerGrant and Gabriel RungJoe, 30 day free trial of Linzess was available to be picked up.  I instructed her to call me if there was a problem and that if they worked, we would then use the copay card.

## 2015-10-07 ENCOUNTER — Encounter: Payer: Self-pay | Admitting: Student

## 2015-11-06 ENCOUNTER — Other Ambulatory Visit: Payer: Self-pay | Admitting: Obstetrics and Gynecology

## 2016-03-30 ENCOUNTER — Encounter (HOSPITAL_COMMUNITY): Payer: Self-pay | Admitting: Emergency Medicine

## 2016-03-30 ENCOUNTER — Ambulatory Visit (HOSPITAL_COMMUNITY)
Admission: EM | Admit: 2016-03-30 | Discharge: 2016-03-30 | Disposition: A | Discharge status: Home / Self Care | Payer: Managed Care, Other (non HMO) | Attending: Emergency Medicine | Admitting: Emergency Medicine

## 2016-03-30 DIAGNOSIS — J019 Acute sinusitis, unspecified: Secondary | ICD-10-CM

## 2016-03-30 MED ORDER — AZITHROMYCIN 250 MG PO TABS: ORAL_TABLET | ORAL | 0 refills | Status: DC

## 2016-03-30 MED ORDER — FLUTICASONE PROPIONATE 50 MCG/ACT NA SUSP: 2.0000 | Freq: Every day | NASAL | 0 refills | Status: DC

## 2016-03-30 NOTE — ED Provider Notes (Signed)
[facility]    CSN: [id number] Arrival date & time: 03/30/16  1608     History   Chief Complaint No chief complaint on file. Sinus issues  HPI Joyce [name] is a 45 y.o. female.   HPI  She is a 45 year old woman here for evaluation of sinus issues. She reports a four-day history of nasal congestion, rhinorrhea, and sinus pressure. She reports a morning headache. She's had some intermittent discomfort in her throat. No ear pain. No cough. No fevers. She does report chills and aches in her upper arms. She has been using over-the-counter medications without improvement. She states she does periodically have issues with her sinuses.  Past Medical History:  Diagnosis Date  . Anxiety state 03/15/2014  . Dehydration 03/15/2014  . DYSURIA, CHRONIC   . Elevated BP 03/15/2014  . GENITAL HERPES   . GERD (gastroesophageal reflux disease)   . Headache(784.0)   . IBS   . Palpitations 03/15/2014    Patient Active Problem List   Diagnosis Date Noted  . Acute upper respiratory infection 05/27/2015  . Rash of face 09/26/2014  . Routine general medical examination at a health care facility 08/22/2014  . Anxiety state 03/15/2014  . Elevated BP 03/15/2014  . GERD (gastroesophageal reflux disease) 06/07/2013  . VAGINITIS, CANDIDAL 03/18/2010  . GENITAL HERPES 02/04/2010  . POLYURIA 02/04/2010  . TOBACCO USE, QUIT 02/04/2010  . IBS 05/30/2008  . Constipation 05/28/2008    Past Surgical History:  Procedure Laterality Date  . BREAST LUMPECTOMY Left   . TUBAL LIGATION  2003    OB History    No data available       Home Medications    Prior to Admission medications   Medication Sig Start Date End Date Taking? Authorizing Provider  azithromycin (ZITHROMAX Z-PAK) 250 MG tablet Take 2 pills today, then 1 pill daily until gone. 03/30/16   Charm RingsErin J Larico Dimock, MD  bifidobacterium infantis (ALIGN) capsule Take 1 capsule by mouth daily. 10/25/13   Newt LukesValerie A Leschber, MD    fluticasone (FLONASE) 50 MCG/ACT nasal spray Place 2 sprays into both nostrils daily. 03/30/16   Charm RingsErin J Montie Swiderski, MD  ibuprofen (ADVIL,MOTRIN) 200 MG tablet Take 800 mg by mouth every 6 (six) hours as needed (pain).    Historical Provider, MD  Linaclotide Karlene Einstein(LINZESS) 290 MCG CAPS capsule Take 1 capsule (290 mcg total) by mouth daily. 07/17/15   Hilarie FredricksonJohn N Perry, MD  MONONESSA 0.25-35 MG-MCG tablet Take as directed 10/19/13   Historical Provider, MD  Multiple Vitamin (MULTIVITAMIN) tablet Take 1 tablet by mouth daily.      Historical Provider, MD  mupirocin ointment (BACTROBAN) 2 % Applied twice a day to the affected area;NOT into eyes. 01/16/15   Pecola LawlessWilliam F Hopper, MD  oxybutynin (DITROPAN-XL) 10 MG 24 hr tablet Take 1 tablet (10 mg total) by mouth at bedtime. 09/09/14   Myrlene BrokerElizabeth A Crawford, MD  triamcinolone cream (KENALOG) 0.1 % Apply 1 application topically 2 (two) times daily. 09/26/14   Corwin LevinsJames W John, MD    Family History Family History  Problem Relation Age of Onset  . Hypertension Mother   . Irritable bowel syndrome Father     Social History Social History  Substance Use Topics  . Smoking status: Former Smoker    Types: Cigarettes  . Smokeless tobacco: Never Used     Comment: Only smokes when she is stress. Married, lives with spouse  . Alcohol use Yes     Comment: 1  glass at bedtime     Allergies   Peach [prunus persica] and Penicillins   Review of Systems Review of Systems As in history of present illness  Physical Exam Triage Vital Signs ED Triage Vitals [03/30/16 1632]  Enc Vitals Group     BP      Pulse      Resp      Temp      Temp src      SpO2      Weight      Height      Head Circumference      Peak Flow      Pain Score 7     Pain Loc      Pain Edu?      Excl. in GC?    No data found.   Updated Vital Signs BP 124/87 (BP Location: Left Arm)   Pulse 94   Temp 99.2 F (37.3 C) (Oral)   Resp 22   LMP 01/29/2016   SpO2 98%   Visual Acuity Right Eye  Distance:   Left Eye Distance:   Bilateral Distance:    Right Eye Near:   Left Eye Near:    Bilateral Near:     Physical Exam  Constitutional: She is oriented to person, place, and time. She appears well-developed and well-nourished. No distress.  HENT:  Mouth/Throat: No oropharyngeal exudate.  TMs normal bilaterally. Mild oropharyngeal erythema. Nasal mucosa is quite erythematous and edematous. Tender to the right maxillary and right frontal sinuses.  Neck: Neck supple.  Cardiovascular: Normal rate, regular rhythm and normal heart sounds.   No murmur heard. Pulmonary/Chest: Effort normal and breath sounds normal. No respiratory distress. She has no wheezes. She has no rales.  Lymphadenopathy:    She has no cervical adenopathy.  Neurological: She is alert and oriented to person, place, and time.     UC Treatments / Results  Labs (all labs ordered are listed, but only abnormal results are displayed) Labs Reviewed - No data to display  EKG  EKG Interpretation None       Radiology No results found.  Procedures Procedures (including critical care time)  Medications Ordered in UC Medications - No data to display   Initial Impression / Assessment and Plan / UC Course  I have reviewed the triage vital signs and the nursing notes.  Pertinent labs & imaging results that were available during my care of the patient were reviewed by me and considered in my medical decision making (see chart for details).  Clinical Course     Treatment for sinusitis with azithromycin. Recommended regular use of Flonase. Nasal saline spray as often as possible. Follow-up as needed.  Final Clinical Impressions(s) / UC Diagnoses   Final diagnoses:  Acute non-recurrent sinusitis, unspecified location    New Prescriptions New Prescriptions   AZITHROMYCIN (ZITHROMAX Z-PAK) 250 MG TABLET    Take 2 pills today, then 1 pill daily until gone.   FLUTICASONE (FLONASE) 50 MCG/ACT NASAL SPRAY     Place 2 sprays into both nostrils daily.     Charm RingsErin J Lenardo Westwood, MD 03/30/16 [phone]

## 2016-03-30 NOTE — ED Triage Notes (Addendum)
Headache, in the morning throat is sore, reports feeling cold, runny nose, sinus congestion.  Denies fever.  Pressure in face

## 2016-03-30 NOTE — Discharge Instructions (Signed)
You are developing a sinus infection. Take azithromycin as prescribed. Use Flonase daily. I recommend using this every day to help prevent recurrences. Use nasal saline spray every 2-3 hours to help wash out the sinuses. Follow-up as needed.

## 2016-04-29 ENCOUNTER — Other Ambulatory Visit: Payer: Self-pay | Admitting: Obstetrics and Gynecology

## 2016-05-03 ENCOUNTER — Other Ambulatory Visit: Payer: Self-pay | Admitting: Obstetrics and Gynecology

## 2016-05-03 DIAGNOSIS — Z1231 Encounter for screening mammogram for malignant neoplasm of breast: Secondary | ICD-10-CM

## 2016-05-04 ENCOUNTER — Emergency Department (HOSPITAL_COMMUNITY)
Admission: EM | Admit: 2016-05-04 | Discharge: 2016-05-04 | Disposition: A | Discharge status: Home / Self Care | Payer: Managed Care, Other (non HMO) | Attending: Emergency Medicine | Admitting: Emergency Medicine

## 2016-05-04 ENCOUNTER — Encounter (HOSPITAL_COMMUNITY): Payer: Self-pay | Admitting: Family Medicine

## 2016-05-04 ENCOUNTER — Emergency Department (HOSPITAL_COMMUNITY): Payer: Managed Care, Other (non HMO)

## 2016-05-04 ENCOUNTER — Ambulatory Visit: Payer: Managed Care, Other (non HMO) | Admitting: Internal Medicine

## 2016-05-04 DIAGNOSIS — F419 Anxiety disorder, unspecified: Secondary | ICD-10-CM | POA: Insufficient documentation

## 2016-05-04 DIAGNOSIS — Z79899 Other long term (current) drug therapy: Secondary | ICD-10-CM | POA: Insufficient documentation

## 2016-05-04 DIAGNOSIS — Z87891 Personal history of nicotine dependence: Secondary | ICD-10-CM | POA: Insufficient documentation

## 2016-05-04 DIAGNOSIS — R0789 Other chest pain: Secondary | ICD-10-CM | POA: Insufficient documentation

## 2016-05-04 DIAGNOSIS — R079 Chest pain, unspecified: Secondary | ICD-10-CM | POA: Diagnosis present

## 2016-05-04 LAB — BASIC METABOLIC PANEL
ANION GAP: 8 (ref 5–15)
BUN: 7 mg/dL (ref 6–20)
CALCIUM: 9.1 mg/dL (ref 8.9–10.3)
CHLORIDE: 108 mmol/L (ref 101–111)
CO2: 25 mmol/L (ref 22–32)
Creatinine, Ser: 0.64 mg/dL (ref 0.44–1.00)
GFR calc non Af Amer: >60 mL/min (ref >60)
Glucose, Bld: 110 mg/dL — ABNORMAL HIGH (ref 65–99)
POTASSIUM: 3.7 mmol/L (ref 3.5–5.1)
Sodium: 141 mmol/L (ref 135–145)

## 2016-05-04 LAB — CBC
HEMATOCRIT: 36.2 % (ref 36.0–46.0)
HEMOGLOBIN: 11.9 g/dL — AB (ref 12.0–15.0)
MCH: 29 pg (ref 26.0–34.0)
MCHC: 32.9 g/dL (ref 30.0–36.0)
MCV: 88.3 fL (ref 78.0–100.0)
Platelets: 265 10*3/uL (ref 150–400)
RBC: 4.1 MIL/uL (ref 3.87–5.11)
RDW: 13.7 % (ref 11.5–15.5)
WBC: 5.6 10*3/uL (ref 4.0–10.5)

## 2016-05-04 LAB — D-DIMER, QUANTITATIVE: D-Dimer, Quant: 0.32 ug/mL-FEU (ref 0.00–0.50)

## 2016-05-04 LAB — I-STAT TROPONIN, ED: TROPONIN I, POC: 0.01 ng/mL (ref 0.00–0.08)

## 2016-05-04 LAB — CYTOLOGY - PAP

## 2016-05-04 MED ORDER — HYDROXYZINE HCL 25 MG PO TABS: 25.0000 mg | ORAL_TABLET | ORAL | 0 refills | Status: DC | PRN
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
## 2016-05-04 NOTE — Discharge Instructions (Signed)
For pain control you may take:  800mg  of ibuprofen (that is usually 4 over the counter pills)  3 times a day (take with food) and acetaminophen 975mg  (this is 3 over the counter pills) four times a day. Do not drink alcohol or combine with other medications that have acetaminophen as an ingredient (Read the labels!).    Follow with your primary care doctor next week and to hesitate to return to the emergency department for any new, worsening or concerning symptoms.

## 2016-05-04 NOTE — ED Provider Notes (Signed)
[facility]-EMERGENCY DEPT Provider Note   CSN: [id number] Arrival date & time: 05/04/16  1824     History   Chief Complaint Chief Complaint  Patient presents with  . Chest Pain    HPI  Blood pressure 145/83, pulse 88, temperature 98.2 F (36.8 C), temperature source Oral, resp. rate 14, last menstrual period 04/17/2016, SpO2 100 %.  Joyce [name] is a 45 y.o. female with past medical history significant for anxiety, palpitations, IBS pressure-like anterior chest pain onset yesterday afternoon while driving with associated shortness of breath. He is a consistent with prior anxiety episodes however she hasn't had an anxiety attack in multiple months and this is lasting longer than normal, she states that the chest pain is about 6 out of 10, exacerbated by deep breathing. She also states that there is a pressure-like vice sensation on the anterior part of her head and face. She does take birth control medication she denies any recent immobilizations, calf pain, leg swelling, history of DVT/PE, hemoptysis. She denies any family history of early cardiac death, tobacco use, methamphetamine use, diabetes, hypertension (she's not on medication but she has had recent elevated blood pressure readings)Blood pressure 145/83, pulse 88, temperature 98.2 F (36.8 C), temperature source Oral, resp. rate 14, last menstrual period 04/17/2016, SpO2 100 %.   Past Medical History:  Diagnosis Date  . Anxiety state 03/15/2014  . Dehydration 03/15/2014  . DYSURIA, CHRONIC   . Elevated BP 03/15/2014  . GENITAL HERPES   . GERD (gastroesophageal reflux disease)   . Headache(784.0)   . IBS   . Palpitations 03/15/2014    Patient Active Problem List   Diagnosis Date Noted  . Acute upper respiratory infection 05/27/2015  . Rash of face 09/26/2014  . Routine general medical examination at a health care facility 08/22/2014  . Anxiety state 03/15/2014  . Elevated BP 03/15/2014  . GERD (gastroesophageal reflux  disease) 06/07/2013  . VAGINITIS, CANDIDAL 03/18/2010  . GENITAL HERPES 02/04/2010  . POLYURIA 02/04/2010  . TOBACCO USE, QUIT 02/04/2010  . IBS 05/30/2008  . Constipation 05/28/2008    Past Surgical History:  Procedure Laterality Date  . BREAST LUMPECTOMY Left   . TUBAL LIGATION  2003    OB History    No data available       Home Medications    Prior to Admission medications   Medication Sig Start Date End Date Taking? Authorizing Provider  fluticasone (FLONASE) 50 MCG/ACT nasal spray Place 2 sprays into both nostrils daily. Patient taking differently: Place 2 sprays into both nostrils daily as needed for allergies.  03/30/16  Yes Charm Rings, MD  ibuprofen (ADVIL,MOTRIN) 200 MG tablet Take 800 mg by mouth every 6 (six) hours as needed (pain).   Yes Historical Provider, MD  MONONESSA 0.25-35 MG-MCG tablet Take 1 tablet by mouth at bedtime. Take as directed 10/19/13  Yes Historical Provider, MD  Multiple Vitamin (MULTIVITAMIN) tablet Take 1 tablet by mouth daily.     Yes Historical Provider, MD  oxybutynin (DITROPAN-XL) 10 MG 24 hr tablet Take 1 tablet (10 mg total) by mouth at bedtime. Patient taking differently: Take 10 mg by mouth at bedtime as needed (for bladder).  09/09/14  Yes Myrlene Broker, MD  PREVIDENT 5000 SENSITIVE 1.1-5 % PSTE Take 1 tablet by mouth 2 (two) times daily. 04/23/16  Yes Historical Provider, MD  hydrOXYzine (ATARAX/VISTARIL) 25 MG tablet Take 1 tablet (25 mg total) by mouth every 4 (four) hours as needed. 05/04/16  Ison Wichmann, PA-C  triamcinolone cream (KENALOG) 0.1 % Apply 1 application topically 2 (two) times daily. 09/26/14   Corwin LevinsJames W John, MD    Family History Family History  Problem Relation Age of Onset  . Hypertension Mother   . Irritable bowel syndrome Father     Social History Social History  Substance Use Topics  . Smoking status: Former Smoker    Types: Cigarettes  . Smokeless tobacco: Never Used     Comment: Only smokes  when she is stress. Married, lives with spouse  . Alcohol use Yes     Comment: 1 glass at bedtime     Allergies   Peach [prunus persica] and Penicillins   Review of Systems Review of Systems  10 systems reviewed and found to be negative, except as noted in the HPI.  Physical Exam Updated Vital Signs BP 136/68   Pulse 84   Temp 97.9 F (36.6 C) (Oral)   Resp 17   LMP 04/17/2016 (Approximate)   SpO2 100%   Physical Exam  Constitutional: She is oriented to person, place, and time. She appears well-developed and well-nourished. No distress.  HENT:  Head: Normocephalic and atraumatic.  Mouth/Throat: Oropharynx is clear and moist.  Eyes: Conjunctivae and EOM are normal. Pupils are equal, round, and reactive to light.  Neck: Normal range of motion. No JVD present. No tracheal deviation present.  Cardiovascular: Normal rate, regular rhythm and intact distal pulses.   Radial pulse equal bilaterally  Pulmonary/Chest: Effort normal and breath sounds normal. No stridor. No respiratory distress. She has no wheezes. She has no rales. She exhibits no tenderness.  Abdominal: Soft. She exhibits no distension and no mass. There is no tenderness. There is no rebound and no guarding.  Musculoskeletal: Normal range of motion. She exhibits no edema or tenderness.  No calf asymmetry, superficial collaterals, palpable cords, edema, Homans sign negative bilaterally.    Neurological: She is alert and oriented to person, place, and time.  Skin: Skin is warm. Capillary refill takes less than 2 seconds. She is not diaphoretic.  Psychiatric: She has a normal mood and affect. Her behavior is normal. Thought content normal.  Nursing note and vitals reviewed.    ED Treatments / Results  Labs (all labs ordered are listed, but only abnormal results are displayed) Labs Reviewed  BASIC METABOLIC PANEL - Abnormal; Notable for the following:       Result Value   Glucose, Bld 110 (*)    All other  components within normal limits  CBC - Abnormal; Notable for the following:    Hemoglobin 11.9 (*)    All other components within normal limits  D-DIMER, QUANTITATIVE ([facility])  Rosezena SensorI-STAT TROPOININ, ED    EKG  EKG Interpretation None       Radiology Dg Chest 2 View  Result Date: 05/04/2016 CLINICAL DATA:  LEFT chest pain and chest heaviness 1 breathing for 1 day, nausea. History of heart palpitations. EXAM: CHEST  2 VIEW COMPARISON:  Thoracic spine radiograph September 06, 2013 FINDINGS: Cardiomediastinal silhouette is normal. No pleural effusions or focal consolidations. Trachea projects midline and there is no pneumothorax. Soft tissue planes and included osseous structures are non-suspicious. Upper thoracic levoscoliosis is likely positional as not present on prior thoracic spine radiographs. Mild chronic single level mid thoracic compression fracture. IMPRESSION: No acute cardiopulmonary process. Electronically Signed   By: Awilda Metroourtnay  Bloomer M.D.   On: 05/04/2016 19:23    Procedures Procedures (including critical care time)  Medications Ordered in ED Medications - No data to display   Initial Impression / Assessment and Plan / ED Course  I have reviewed the triage vital signs and the nursing notes.  Pertinent labs & imaging results that were available during my care of the patient were reviewed by me and considered in my medical decision making (see chart for details).  Clinical Course     Vitals:   05/04/16 2130 05/04/16 2200 05/04/16 2300 05/04/16 2315  BP:  132/88 136/68   Pulse: 88 79 84   Resp: 20 19 17    Temp:    97.9 F (36.6 C)  TempSrc:    Oral  SpO2: 100% 100% 100%      Audry PiliHolley M Cooler is 45 y.o. female presenting with chest pain onset yesterday with associated pressure like headache, she does have a history of anxiety and states that this does feel similar to prior anxiety episodes. Patient is low risk by heart score, EKG nonischemic, troponin negative.  Given her hormonal birth control will check a d-dimer however physical exam is not consistent with DVT she's not Neck or tachycardic. He climbs any pain medication or anxiety medication in the ED.  Dimer negative, I have encouraged this patient to discuss this with her primary care doctor next week, short prescription of Atarax given and patient is encouraged to treat her pain with over-the-counter medications.  Evaluation does not show pathology that would require ongoing emergent intervention or inpatient treatment. Pt is hemodynamically stable and mentating appropriately. Discussed findings and plan with patient/guardian, who agrees with care plan. All questions answered. Return precautions discussed and outpatient follow up given.      Final Clinical Impressions(s) / ED Diagnoses   Final diagnoses:  Atypical chest pain  Anxiety    New Prescriptions Discharge Medication List as of 05/04/2016 11:15 PM    START taking these medications   Details  hydrOXYzine (ATARAX/VISTARIL) 25 MG tablet Take 1 tablet (25 mg total) by mouth every 4 (four) hours as needed., Starting Wed 05/04/2016, [employer]         Pinkie Manger, PA-C 05/04/16 2343    Loren Raceravid Yelverton, MD 05/05/16 [phone]

## 2016-06-03 ENCOUNTER — Ambulatory Visit
Admission: RE | Admit: 2016-06-03 | Discharge: 2016-06-03 | Disposition: A | Discharge status: Home / Self Care | Payer: Managed Care, Other (non HMO) | Source: Ambulatory Visit | Attending: Obstetrics and Gynecology | Admitting: Obstetrics and Gynecology

## 2016-06-03 DIAGNOSIS — Z1231 Encounter for screening mammogram for malignant neoplasm of breast: Secondary | ICD-10-CM

## 2018-11-26 ENCOUNTER — Other Ambulatory Visit: Payer: Self-pay

## 2018-11-26 ENCOUNTER — Ambulatory Visit (INDEPENDENT_AMBULATORY_CARE_PROVIDER_SITE_OTHER): Payer: Managed Care, Other (non HMO) | Admitting: Family Medicine

## 2018-11-26 ENCOUNTER — Encounter: Payer: Self-pay | Admitting: Family Medicine

## 2018-11-26 VITALS — BP 142/76 | HR 93 | Temp 98.9°F | Resp 16 | Ht 66.0 in | Wt 169.2 lb

## 2018-11-26 DIAGNOSIS — R030 Elevated blood-pressure reading, without diagnosis of hypertension: Secondary | ICD-10-CM | POA: Diagnosis not present

## 2018-11-26 DIAGNOSIS — R002 Palpitations: Secondary | ICD-10-CM

## 2018-11-26 DIAGNOSIS — R358 Other polyuria: Secondary | ICD-10-CM | POA: Diagnosis not present

## 2018-11-26 DIAGNOSIS — Z13220 Encounter for screening for lipoid disorders: Secondary | ICD-10-CM

## 2018-11-26 DIAGNOSIS — R3589 Other polyuria: Secondary | ICD-10-CM

## 2018-11-26 DIAGNOSIS — Z0001 Encounter for general adult medical examination with abnormal findings: Secondary | ICD-10-CM | POA: Diagnosis not present

## 2018-11-26 DIAGNOSIS — Z130 Encounter for screening for diseases of the blood and blood-forming organs and certain disorders involving the immune mechanism: Secondary | ICD-10-CM

## 2018-11-26 DIAGNOSIS — Z7689 Persons encountering health services in other specified circumstances: Secondary | ICD-10-CM

## 2018-11-26 DIAGNOSIS — Z114 Encounter for screening for human immunodeficiency virus [HIV]: Secondary | ICD-10-CM

## 2018-11-26 DIAGNOSIS — Z0000 Encounter for general adult medical examination without abnormal findings: Secondary | ICD-10-CM

## 2018-11-26 LAB — POCT URINALYSIS DIP (MANUAL ENTRY)
Bilirubin, UA: NEGATIVE
Glucose, UA: NEGATIVE mg/dL
Ketones, POC UA: NEGATIVE mg/dL
Leukocytes, UA: NEGATIVE
Nitrite, UA: NEGATIVE
Protein Ur, POC: NEGATIVE mg/dL
Spec Grav, UA: 1.025 (ref 1.010–1.025)
Urobilinogen, UA: 0.2 E.U./dL
pH, UA: 6.5 (ref 5.0–8.0)

## 2018-11-26 NOTE — Patient Instructions (Addendum)
   If you have lab work done today you will be contacted with your lab results within the next 2 weeks.  If you have not heard from us then please contact us. The fastest way to get your results is to register for My Chart.   IF you received an x-ray today, you will receive an invoice from [facility]. Please contact [facility] at [phone] with questions or concerns regarding your invoice.   IF you received labwork today, you will receive an invoice from [facility]. Please contact [facility] at [phone] with questions or concerns regarding your invoice.   Our billing staff will not be able to assist you with questions regarding bills from these companies.  You will be contacted with the lab results as soon as they are available. The fastest way to get your results is to activate your My Chart account. Instructions are located on the last page of this paperwork. If you have not heard from us regarding the results in 2 weeks, please contact this office.     Migraine Headache A migraine headache is an intense, throbbing pain on one side or both sides of the head. Migraine headaches may also cause other symptoms, such as nausea, vomiting, and sensitivity to light and noise. A migraine headache can last from 4 hours to 3 days. Talk with your doctor about what things may bring on (trigger) your migraine headaches. What are the causes? The exact cause of this condition is not known. However, a migraine may be caused when nerves in the brain become irritated and release chemicals that cause inflammation of blood vessels. This inflammation causes pain. This condition may be triggered or caused by:  Drinking alcohol.  Smoking.  Taking medicines, such as: ? Medicine used to treat chest pain (nitroglycerin). ? Birth control pills. ? Estrogen. ? Certain blood pressure medicines.  Eating or drinking products that contain nitrates, glutamate, aspartame, or tyramine. Aged  cheeses, chocolate, or caffeine may also be triggers.  Doing physical activity. Other things that may trigger a migraine headache include:  Menstruation.  Pregnancy.  Hunger.  Stress.  Lack of sleep or too much sleep.  Weather changes.  Fatigue. What increases the risk? The following factors may make you more likely to experience migraine headaches:  Being a certain age. This condition is more common in people who are 25-55 years old.  Being female.  Having a family history of migraine headaches.  Being Caucasian.  Having a mental health condition, such as depression or anxiety.  Being obese. What are the signs or symptoms? The main symptom of this condition is pulsating or throbbing pain. This pain may:  Happen in any area of the head, such as on one side or both sides.  Interfere with daily activities.  Get worse with physical activity.  Get worse with exposure to bright lights or loud noises. Other symptoms may include:  Nausea.  Vomiting.  Dizziness.  General sensitivity to bright lights, loud noises, or smells. Before you get a migraine headache, you may get warning signs (an aura). An aura may include:  Seeing flashing lights or having blind spots.  Seeing bright spots, halos, or zigzag lines.  Having tunnel vision or blurred vision.  Having numbness or a tingling feeling.  Having trouble talking.  Having muscle weakness. Some people have symptoms after a migraine headache (postdromal phase), such as:  Feeling tired.  Difficulty concentrating. How is this diagnosed? A migraine headache can be diagnosed based on:    Your symptoms.  A physical exam.  Tests, such as: ? CT scan or an MRI of the head. These imaging tests can help rule out other causes of headaches. ? Taking fluid from the spine (lumbar puncture) and analyzing it (cerebrospinal fluid analysis, or CSF analysis). How is this treated? This condition may be treated with  medicines that:  Relieve pain.  Relieve nausea.  Prevent migraine headaches. Treatment for this condition may also include:  Acupuncture.  Lifestyle changes like avoiding foods that trigger migraine headaches.  Biofeedback.  Cognitive behavioral therapy. Follow these instructions at home: Medicines  Take over-the-counter and prescription medicines only as told by your health care provider.  Ask your health care provider if the medicine prescribed to you: ? Requires you to avoid driving or using heavy machinery. ? Can cause constipation. You may need to take these actions to prevent or treat constipation:  Drink enough fluid to keep your urine pale yellow.  Take over-the-counter or prescription medicines.  Eat foods that are high in fiber, such as beans, whole grains, and fresh fruits and vegetables.  Limit foods that are high in fat and processed sugars, such as fried or sweet foods. Lifestyle  Do not drink alcohol.  Do not use any products that contain nicotine or tobacco, such as cigarettes, e-cigarettes, and chewing tobacco. If you need help quitting, ask your health care provider.  Get at least 8 hours of sleep every night.  Find ways to manage stress, such as meditation, deep breathing, or yoga. General instructions      Keep a journal to find out what may trigger your migraine headaches. For example, write down: ? What you eat and drink. ? How much sleep you get. ? Any change to your diet or medicines.  If you have a migraine headache: ? Avoid things that make your symptoms worse, such as bright lights. ? It may help to lie down in a dark, quiet room. ? Do not drive or use heavy machinery. ? Ask your health care provider what activities are safe for you while you are experiencing symptoms.  Keep all follow-up visits as told by your health care provider. This is important. Contact a health care provider if:  You develop symptoms that are different or  more severe than your usual migraine headache symptoms.  You have more than 15 headache days in one month. Get help right away if:  Your migraine headache becomes severe.  Your migraine headache lasts longer than 72 hours.  You have a fever.  You have a stiff neck.  You have vision loss.  Your muscles feel weak or like you cannot control them.  You start to lose your balance often.  You have trouble walking.  You faint.  You have a seizure. Summary  A migraine headache is an intense, throbbing pain on one side or both sides of the head. Migraines may also cause other symptoms, such as nausea, vomiting, and sensitivity to light and noise.  This condition may be treated with medicines and lifestyle changes. You may also need to avoid certain things that trigger a migraine headache.  Keep a journal to find out what may trigger your migraine headaches.  Contact your health care provider if you have more than 15 headache days in a month or you develop symptoms that are different or more severe than your usual migraine headache symptoms. This information is not intended to replace advice given to you by your health care provider. Make sure   you discuss any questions you have with your health care provider. Document Released: 05/02/2005 Document Revised: 08/24/2018 Document Reviewed: 06/14/2018 [employer] Patient Education  2020 [employer]  

## 2018-11-26 NOTE — Progress Notes (Signed)
Chief Complaint  Patient presents with   Annual Exam    no pap     Subjective:  Joyce [name] is a 48 y.o. female here for a health maintenance visit.  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     Multiple Vitamin (MULTIVITAMIN) tablet Take 1 tablet by mouth daily.       norgestimate-ethinyl estradiol (ESTARYLLA) 0.25-35 MG-MCG tablet Take 1 tablet by mouth daily.     fluticasone (FLONASE) 50 MCG/ACT nasal spray Place 2 sprays into both nostrils daily. (Patient taking differently: Place 2 sprays into both nostrils daily as needed for allergies. ) 16 g 0   hydrOXYzine (ATARAX/VISTARIL) 25 MG tablet Take 1 tablet (25 mg total) by mouth every 4 (four) hours as needed. 25 tablet 0   ibuprofen (ADVIL,MOTRIN) 200 MG tablet Take 800 mg by mouth every 6 (six) hours as needed  (pain).     MONONESSA 0.25-35 MG-MCG tablet Take 1 tablet by mouth at bedtime. Take as directed     oxybutynin (DITROPAN-XL) 10 MG 24 hr tablet Take 1 tablet (10 mg total) by mouth at bedtime. (Patient taking differently: Take 10 mg by mouth at bedtime as needed (for bladder). ) 30 tablet 3   PREVIDENT 5000 SENSITIVE 1.1-5 % PSTE Take 1 tablet by mouth 2 (two) times daily.     triamcinolone cream (KENALOG) 0.1 % Apply 1 application topically 2 (two) times daily. 30 g 0   No facility-administered medications prior to visit.     Allergies  Allergen Reactions   Peach [Prunus Persica] Itching and Rash   Penicillins Itching and Rash     Family History  Problem Relation Age of Onset   Hypertension Mother    Irritable bowel syndrome Father      Health Habits: Dental Exam: up to date Eye Exam: up to date Current exercise activities: walking/running Diet: balanced diet  Social History   Socioeconomic History   Marital status: Married    Spouse name: Not on file   Number of children: 3   Years of education: Not on file   Highest education level: Not on file  Occupational History   Occupation: customer service    Employer: [employer]  Social Needs   Financial resource strain: Not  on file   Food insecurity    Worry: Not on file    Inability: Not on file   Transportation needs    Medical: Not on file    Non-medical: Not on file  Tobacco Use   Smoking status: Former Smoker    Types: Cigarettes   Smokeless tobacco: Never Used   Tobacco comment: Only smokes when she is stress. Married, lives with spouse  Substance and Sexual Activity   Alcohol use: Yes    Comment: 1 glass at bedtime   Drug use: No   Sexual activity: Not on file  Lifestyle   Physical activity    Days per week: Not on file    Minutes per session: Not on file   Stress: Not on file  Relationships   Social connections    Talks on phone: Not on file    Gets together: Not on file     Attends religious service: Not on file    Active member of club or organization: Not on file    Attends meetings of clubs or organizations: Not on file    Relationship status: Not on file   Intimate partner violence    Fear of current or ex partner: Not on file    Emotionally abused: Not on file    Physically abused: Not on file    Forced sexual activity: Not on file  Other Topics Concern   Not on file  Social History Narrative   Not on file   Social History   Substance and Sexual Activity  Alcohol Use Yes   Comment: 1 glass at bedtime   Social History   Tobacco Use  Smoking Status Former Smoker   Types: Cigarettes  Smokeless Tobacco Never Used  Tobacco Comment   Only smokes when she is stress. Married, lives with spouse   Social History   Substance and Sexual Activity  Drug Use No    GYN: Sexual Health Menstrual status: regular menses LMP: No LMP recorded. Last pap smear: see HM section History of abnormal pap smears:  Sexually active: with female partner Current contraception:   Health Maintenance: See under health Maintenance activity for review of completion dates as well. Immunization History  Administered Date(s) Administered   Tdap 08/22/2014      Depression Screen-PHQ2/9 Depression screen PHQ 2/9 11/26/2018  Decreased Interest 0  Down, Depressed, Hopeless 0  PHQ - 2 Score 0       Depression Severity and Treatment Recommendations:  0-4= None  5-9= Mild / Treatment: Support, educate to call if worse; return in one month  10-14= Moderate / Treatment: Support, watchful waiting; Antidepressant or Psycotherapy  15-19= Moderately severe / Treatment: Antidepressant OR Psychotherapy  >= 20 = Major depression, severe / Antidepressant AND Psychotherapy    Review of Systems   Review of Systems  Constitutional: Negative for chills and fever.  Eyes: Negative for blurred vision and double vision.  Respiratory: Negative for cough, shortness of  breath and wheezing.   Cardiovascular: Negative for chest pain and palpitations.  Gastrointestinal: Negative for abdominal pain, nausea and vomiting.  Genitourinary: Positive for frequency. Negative for dysuria and urgency.  Musculoskeletal: Negative for back pain, myalgias and neck pain.  Skin: Negative for itching and rash.  Neurological: Positive for headaches. Negative for dizziness and tingling.  Psychiatric/Behavioral: Negative for depression. The patient is not nervous/anxious.     See HPI for ROS as well.    Objective:   Vitals:   11/26/18 1529 11/26/18  1551  BP: (!) 153/85 (!) 142/76  Pulse: 93   Resp: 16   Temp: 98.9 F (37.2 C)   TempSrc: Oral   SpO2: 99%   Weight: 169 lb 3.2 oz (76.7 kg)   Height:  (1.676 m)    BP Readings from Last 3 Encounters:  11/26/18 (!) 142/76  05/04/16 136/68  03/30/16 124/87    Body mass index is 27.31 kg/m.  Physical Exam Constitutional:      Appearance: Normal appearance. She is normal weight.  HENT:     Head: Normocephalic and atraumatic.     Nose: Nose normal. No congestion.  Eyes:     Extraocular Movements: Extraocular movements intact.     Conjunctiva/sclera: Conjunctivae normal.  Neck:     Musculoskeletal: Normal range of motion and neck supple.  Cardiovascular:     Rate and Rhythm: Normal rate and regular rhythm.     Pulses: Normal pulses.     Heart sounds: Normal heart sounds. No murmur. No friction rub.  Pulmonary:     Effort: Pulmonary effort is normal. No respiratory distress.     Breath sounds: Normal breath sounds. No stridor. No wheezing, rhonchi or rales.  Chest:     Chest wall: No tenderness.  Abdominal:     General: Bowel sounds are normal. There is no distension.     Palpations: Abdomen is soft. There is no mass.     Tenderness: There is no abdominal tenderness. There is no right CVA tenderness, left CVA tenderness, guarding or rebound.     Hernia: No hernia is present.  Musculoskeletal: Normal  range of motion.  Skin:    General: Skin is warm.     Capillary Refill: Capillary refill takes less than 2 seconds.  Neurological:     General: No focal deficit present.     Mental Status: She is alert and oriented to person, place, and time.  Psychiatric:        Mood and Affect: Mood normal.        Behavior: Behavior normal.        Thought Content: Thought content normal.        Judgment: Judgment normal.        Assessment/Plan:   Patient was seen for a health maintenance exam.  Counseled the patient on health maintenance issues. Reviewed her health mainteance schedule and ordered appropriate tests (see orders.) Counseled on regular exercise and weight management. Recommend regular eye exams and dental cleaning.   The following issues were addressed today for health maintenance:   Joyce [name] was seen today for annual exam.  Diagnoses and all orders for this visit:  Encounter for health maintenance examination in adult- reviewed routine female health maintenance Advised pt to take vitamin D 2000 units  Continue to follow up with Gyne for paps  Eye doctor and dentist  Elevated BP without diagnosis of hypertension- will assess and if bp is elevated will discuss changed ocps since she has estrogen in her ocps -     Comprehensive metabolic panel -     TSH -     Lipid panel  Screening, anemia, deficiency, iron -     CBC  Palpitations- will check for metabolic/endocrine factors No symptoms currently -     Comprehensive metabolic panel -     CBC -     TSH -     Hemoglobin A1c  Polyuria- will screen for diabetes and UTI -     Comprehensive metabolic panel -  Hemoglobin A1c  Screening for HIV (human immunodeficiency virus)- verbal consent given -     HIV Antibody (routine testing w rflx)  Screening, lipid  Establishing care with new doctor, encounter for- reviewed general function of the clinic and our role in her healthcare    Return in about 4 weeks (around  12/24/2018) for migraine headache, lab follow up .    Body mass index is 27.31 kg/m.:  Discussed the patient's BMI with patient. The BMI body mass index is 27.31 kg/m.     No future appointments.  Patient Instructions       If you have lab work done today you will be contacted with your lab results within the next 2 weeks.  If you have not heard from [place] then please contact us. The fastest way to get your results is to register for My Chart.   IF you received an x-ray today, you will receive an invoice from [facility]. Please contact [facility] at [phone] with questions or concerns regarding your invoice.   IF you received labwork today, you will receive an invoice from [facility]. Please contact [facility] at [phone] with questions or concerns regarding your invoice.   Our billing staff will not be able to assist you with questions regarding bills from these companies.  You will be contacted with the lab results as soon as they are available. The fastest way to get your results is to activate your My Chart account. Instructions are located on the last page of this paperwork. If you have not heard from [place] regarding the results in 2 weeks, please contact this office.      Migraine Headache A migraine headache is an intense, throbbing pain on one side or both sides of the head. Migraine headaches may also cause other symptoms, such as nausea, vomiting, and sensitivity to light and noise. A migraine headache can last from 4 hours to 3 days. Talk with your doctor about what things may bring on (trigger) your migraine headaches. What are the causes? The exact cause of this condition is not known. However, a migraine may be caused when nerves in the brain become irritated and release chemicals that cause inflammation of blood vessels. This inflammation causes pain. This condition may be triggered or caused by:  Drinking alcohol.  Smoking.  Taking medicines,  such as: ? Medicine used to treat chest pain (nitroglycerin). ? Birth control pills. ? Estrogen. ? Certain blood pressure medicines.  Eating or drinking products that contain nitrates, glutamate, aspartame, or tyramine. Aged cheeses, chocolate, or caffeine may also be triggers.  Doing physical activity. Other things that may trigger a migraine headache include:  Menstruation.  Pregnancy.  Hunger.  Stress.  Lack of sleep or too much sleep.  Weather changes.  Fatigue. What increases the risk? The following factors may make you more likely to experience migraine headaches:  Being a certain age. This condition is more common in people who are 4-7 years old.  Being female.  Having a family history of migraine headaches.  Being Caucasian.  Having a mental health condition, such as depression or anxiety.  Being obese. What are the signs or symptoms? The main symptom of this condition is pulsating or throbbing pain. This pain may:  Happen in any area of the head, such as on one side or both sides.  Interfere with daily activities.  Get worse with physical activity.  Get worse with exposure to bright lights or loud noises. Other symptoms may include:  Nausea.  Vomiting.  Dizziness.  General sensitivity to bright lights, loud noises, or smells. Before you get a migraine headache, you may get warning signs (an aura). An aura may include:  Seeing flashing lights or having blind spots.  Seeing bright spots, halos, or zigzag lines.  Having tunnel vision or blurred vision.  Having numbness or a tingling feeling.  Having trouble talking.  Having muscle weakness. Some people have symptoms after a migraine headache (postdromal phase), such as:  Feeling tired.  Difficulty concentrating. How is this diagnosed? A migraine headache can be diagnosed based on:  Your symptoms.  A physical exam.  Tests, such as: ? CT scan or an MRI of the head. These imaging  tests can help rule out other causes of headaches. ? Taking fluid from the spine (lumbar puncture) and analyzing it (cerebrospinal fluid analysis, or CSF analysis). How is this treated? This condition may be treated with medicines that:  Relieve pain.  Relieve nausea.  Prevent migraine headaches. Treatment for this condition may also include:  Acupuncture.  Lifestyle changes like avoiding foods that trigger migraine headaches.  Biofeedback.  Cognitive behavioral therapy. Follow these instructions at home: Medicines  Take over-the-counter and prescription medicines only as told by your health care provider.  Ask your health care provider if the medicine prescribed to you: ? Requires you to avoid driving or using heavy machinery. ? Can cause constipation. You may need to take these actions to prevent or treat constipation:  Drink enough fluid to keep your urine pale yellow.  Take over-the-counter or prescription medicines.  Eat foods that are high in fiber, such as beans, whole grains, and fresh fruits and vegetables.  Limit foods that are high in fat and processed sugars, such as fried or sweet foods. Lifestyle  Do not drink alcohol.  Do not use any products that contain nicotine or tobacco, such as cigarettes, e-cigarettes, and chewing tobacco. If you need help quitting, ask your health care provider.  Get at least 8 hours of sleep every night.  Find ways to manage stress, such as meditation, deep breathing, or yoga. General instructions      Keep a journal to find out what may trigger your migraine headaches. For example, write down: ? What you eat and drink. ? How much sleep you get. ? Any change to your diet or medicines.  If you have a migraine headache: ? Avoid things that make your symptoms worse, such as bright lights. ? It may help to lie down in a dark, quiet room. ? Do not drive or use heavy machinery. ? Ask your health care provider what activities  are safe for you while you are experiencing symptoms.  Keep all follow-up visits as told by your health care provider. This is important. Contact a health care provider if:  You develop symptoms that are different or more severe than your usual migraine headache symptoms.  You have more than 15 headache days in one month. Get help right away if:  Your migraine headache becomes severe.  Your migraine headache lasts longer than 72 hours.  You have a fever.  You have a stiff neck.  You have vision loss.  Your muscles feel weak or like you cannot control them.  You start to lose your balance often.  You have trouble walking.  You faint.  You have a seizure. Summary  A migraine headache is an intense, throbbing pain on one side or both sides of the head. Migraines may also  cause other symptoms, such as nausea, vomiting, and sensitivity to light and noise.  This condition may be treated with medicines and lifestyle changes. You may also need to avoid certain things that trigger a migraine headache.  Keep a journal to find out what may trigger your migraine headaches.  Contact your health care provider if you have more than 15 headache days in a month or you develop symptoms that are different or more severe than your usual migraine headache symptoms. This information is not intended to replace advice given to you by your health care provider. Make sure you discuss any questions you have with your health care provider. Document Released: 05/02/2005 Document Revised: 08/24/2018 Document Reviewed: 06/14/2018 Elsevier Patient Education  2020 [employer]

## 2018-11-27 LAB — LIPID PANEL
Chol/HDL Ratio: 2.8 ratio (ref 0.0–4.4)
Cholesterol, Total: 202 mg/dL — ABNORMAL HIGH (ref 100–199)
HDL: 71 mg/dL (ref >39)
LDL Calculated: 114 mg/dL — ABNORMAL HIGH (ref 0–99)
Triglycerides: 84 mg/dL (ref 0–149)
VLDL Cholesterol Cal: 17 mg/dL (ref 5–40)

## 2018-11-27 LAB — COMPREHENSIVE METABOLIC PANEL
ALT: 15 IU/L (ref 0–32)
AST: 19 IU/L (ref 0–40)
Albumin/Globulin Ratio: 1.4 (ref 1.2–2.2)
Albumin: 4.2 g/dL (ref 3.8–4.8)
Alkaline Phosphatase: 109 IU/L (ref 39–117)
BUN/Creatinine Ratio: 14 (ref 9–23)
BUN: 10 mg/dL (ref 6–24)
Bilirubin Total: <0.2 mg/dL (ref 0.0–1.2)
CO2: 23 mmol/L (ref 20–29)
Calcium: 8.8 mg/dL (ref 8.7–10.2)
Chloride: 105 mmol/L (ref 96–106)
Creatinine, Ser: 0.71 mg/dL (ref 0.57–1.00)
GFR calc Af Amer: 118 mL/min/{1.73_m2} (ref >59)
GFR calc non Af Amer: 102 mL/min/{1.73_m2} (ref >59)
Globulin, Total: 2.9 g/dL (ref 1.5–4.5)
Glucose: 95 mg/dL (ref 65–99)
Potassium: 4.5 mmol/L (ref 3.5–5.2)
Sodium: 141 mmol/L (ref 134–144)
Total Protein: 7.1 g/dL (ref 6.0–8.5)

## 2018-11-27 LAB — CBC
Hematocrit: 40.4 % (ref 34.0–46.6)
Hemoglobin: 13 g/dL (ref 11.1–15.9)
MCH: 28.8 pg (ref 26.6–33.0)
MCHC: 32.2 g/dL (ref 31.5–35.7)
MCV: 90 fL (ref 79–97)
Platelets: 316 10*3/uL (ref 150–450)
RBC: 4.51 x10E6/uL (ref 3.77–5.28)
RDW: 13.1 % (ref 11.7–15.4)
WBC: 5.1 10*3/uL (ref 3.4–10.8)

## 2018-11-27 LAB — HEMOGLOBIN A1C
Est. average glucose Bld gHb Est-mCnc: 111 mg/dL
Hgb A1c MFr Bld: 5.5 % (ref 4.8–5.6)

## 2018-11-27 LAB — TSH: TSH: 1.44 u[IU]/mL (ref 0.450–4.500)

## 2018-11-27 LAB — HIV ANTIBODY (ROUTINE TESTING W REFLEX): HIV Screen 4th Generation wRfx: NONREACTIVE

## 2018-11-30 ENCOUNTER — Encounter: Payer: Self-pay | Admitting: Family Medicine

## 2018-11-30 ENCOUNTER — Telehealth: Payer: Self-pay

## 2018-11-30 NOTE — Telephone Encounter (Signed)
Pt labs results sent via mail to pt home address at [address], [place], [place] [address] Dgaddy, CMA

## 2018-12-03 ENCOUNTER — Other Ambulatory Visit: Payer: Self-pay | Admitting: *Deleted

## 2018-12-03 DIAGNOSIS — Z20822 Contact with and (suspected) exposure to covid-19: Secondary | ICD-10-CM

## 2018-12-07 ENCOUNTER — Telehealth: Payer: Self-pay | Admitting: Family Medicine

## 2018-12-07 DIAGNOSIS — M79641 Pain in right hand: Secondary | ICD-10-CM

## 2018-12-07 NOTE — Telephone Encounter (Signed)
Relation to pt: self  Call back number: [phone]    Reason for call:  Patient would like a referral to a hand specialist do to right hand pain, patient states she doesn't want to have to pay two different co pays one for PCP and one for specialist, please advise when referral is placed.

## 2018-12-09 LAB — NOVEL CORONAVIRUS, NAA: SARS-CoV-2, NAA: NOT DETECTED

## 2018-12-12 NOTE — Telephone Encounter (Signed)
I have attempted to call pt to get some more information. There was no answer and left a general message to call back.  Are we able to send pt to a hand specialist referral for her right hand pain without evaluation on our end? Pt doesn't want to pay 2 co-pays.

## 2018-12-12 NOTE — Telephone Encounter (Signed)
Please let the patient know that the referral has been placed.

## 2018-12-12 NOTE — Telephone Encounter (Signed)
Attempted to call the pt to inform her that the referral has been placed, however this time the mailbox is full and unable to leave message

## 2018-12-26 ENCOUNTER — Other Ambulatory Visit: Payer: Self-pay

## 2018-12-26 ENCOUNTER — Encounter: Payer: Self-pay | Admitting: Family Medicine

## 2018-12-26 ENCOUNTER — Ambulatory Visit: Payer: Managed Care, Other (non HMO) | Admitting: Family Medicine

## 2018-12-26 VITALS — BP 128/80 | HR 91 | Temp 99.2°F | Resp 17 | Ht 66.0 in | Wt 174.8 lb

## 2018-12-26 DIAGNOSIS — Z760 Encounter for issue of repeat prescription: Secondary | ICD-10-CM | POA: Diagnosis not present

## 2018-12-26 DIAGNOSIS — N898 Other specified noninflammatory disorders of vagina: Secondary | ICD-10-CM

## 2018-12-26 LAB — POCT WET + KOH PREP
Trich by wet prep: ABSENT
Yeast by KOH: ABSENT
Yeast by wet prep: ABSENT

## 2018-12-26 MED ORDER — CLOTRIMAZOLE-BETAMETHASONE 1-0.05 % EX CREA: 1.0000 "application " | TOPICAL_CREAM | Freq: Two times a day (BID) | CUTANEOUS | 0 refills | Status: DC

## 2018-12-26 NOTE — Progress Notes (Signed)
Established Patient Office Visit  Subjective:  Patient ID: Joyce [name], female    DOB: 1970-07-23  Age: 48 y.o. MRN: [id number]  CC:  Chief Complaint  Patient presents with  . migraine and lab f/u    pt wants to talk with provider about another issue she would not disclose to me  . Medication Refill    valtrex, cetirizine, estraylla    HPI Joyce [name] presents for   She would like to review her labs and to discuss medication refills.   Active issue today: She has vaginal irritation and now she has tingling, itching and burning She states that she had a soak  She reports that she took a vaginal steam with herbal to help with vaginal dryness and correcting her pH She states that she was instructed to do this once a month in [place]. She also did a detox as well.  Now she reports that the skin looks like it has a scab   Past Medical History:  Diagnosis Date  . Anxiety state 03/15/2014  . Dehydration 03/15/2014  . DYSURIA, CHRONIC   . Elevated BP 03/15/2014  . GENITAL HERPES   . GERD (gastroesophageal reflux disease)   . Headache(784.0)   . IBS   . Palpitations 03/15/2014    Past Surgical History:  Procedure Laterality Date  . BREAST LUMPECTOMY Left   . TUBAL LIGATION  2003    Family History  Problem Relation Age of Onset  . Hypertension Mother   . Irritable bowel syndrome Father     Social History   Socioeconomic History  . Marital status: Married    Spouse name: Not on file  . Number of children: 3  . Years of education: Not on file  . Highest education level: Not on file  Occupational History  . Occupation: Research scientist (physical sciences): [employer]  . Financial resource strain: Not on file  . Food insecurity    Worry: Not on file    Inability: Not on file  . Transportation needs    Medical: Not on file    Non-medical: Not on file  Tobacco Use  . Smoking status: Former Smoker    Types: Cigarettes  . Smokeless tobacco:  Never Used  . Tobacco comment: Only smokes when she is stress. Married, lives with spouse  Substance and Sexual Activity  . Alcohol use: Yes    Comment: 1 glass at bedtime  . Drug use: No  . Sexual activity: Not on file  Lifestyle  . Physical activity    Days per week: Not on file    Minutes per session: Not on file  . Stress: Not on file  Relationships  . Social Herbalist on phone: Not on file    Gets together: Not on file    Attends religious service: Not on file    Active member of club or organization: Not on file    Attends meetings of clubs or organizations: Not on file    Relationship status: Not on file  . Intimate partner violence    Fear of current or ex partner: Not on file    Emotionally abused: Not on file    Physically abused: Not on file    Forced sexual activity: Not on file  Other Topics Concern  . Not on file  Social History Narrative  . Not on file    Outpatient Medications Prior to Visit  Medication Sig  Dispense Refill  . cetirizine (ZYRTEC) 10 MG tablet Take 10 mg by mouth daily.    . Multiple Vitamin (MULTIVITAMIN) tablet Take 1 tablet by mouth daily.      . norgestimate-ethinyl estradiol (ESTARYLLA) 0.25-35 MG-MCG tablet Take 1 tablet by mouth daily.     No facility-administered medications prior to visit.     Allergies  Allergen Reactions  . Peach [Prunus Persica] Itching and Rash  . Penicillins Itching and Rash    ROS Review of Systems Review of Systems  Constitutional: Negative for activity change, appetite change, chills and fever.  HENT: Negative for congestion, nosebleeds, trouble swallowing and voice change.   Respiratory: Negative for cough, shortness of breath and wheezing.   Gastrointestinal: Negative for diarrhea, nausea and vomiting.  Genitourinary: Negative for difficulty urinating, dysuria, flank pain and hematuria.  Musculoskeletal: Negative for back pain, joint swelling and neck pain.  Neurological: Negative for  dizziness, speech difficulty, light-headedness and numbness.  See HPI. All other review of systems negative.     Objective:    Physical Exam  BP 128/80 (BP Location: Left Arm, Patient Position: Sitting, Cuff Size: Normal)   Pulse 91   Temp 99.2 F (37.3 C) (Oral)   Resp 17   Ht 5\' 6"  (1.676 m)   Wt 174 lb 12.8 oz (79.3 kg)   LMP 11/07/2018 Comment: on bc pill, have a menses one month then skip two months per pt  SpO2 100%   BMI 28.21 kg/m  Wt Readings from Last 3 Encounters:  12/26/18 174 lb 12.8 oz (79.3 kg)  11/26/18 169 lb 3.2 oz (76.7 kg)  07/03/15 170 lb 12.8 oz (77.5 kg)    Gen: alert and oriented x3, in NAD HEENT: conjunctiva normal, EOM intact bilaterally Resp:: normal effort, no pulm distress Vaginal exam- Chaperone Present Labia with mild erythema and thermal burns with mild excoriation Urethral meatus normal appearing without erythema Vagina without discharge No CMT, ovaries small and not palpable Uterus midline, nontender   Health Maintenance Due  Topic Date Due  . INFLUENZA VACCINE  12/15/2018    There are no preventive care reminders to display for this patient.  Lab Results  Component Value Date   TSH 1.440 11/26/2018   Lab Results  Component Value Date   WBC 5.1 11/26/2018   HGB 13.0 11/26/2018   HCT 40.4 11/26/2018   MCV 90 11/26/2018   PLT 316 11/26/2018   Lab Results  Component Value Date   NA 141 11/26/2018   K 4.5 11/26/2018   CO2 23 11/26/2018   GLUCOSE 95 11/26/2018   BUN 10 11/26/2018   CREATININE 0.71 11/26/2018   BILITOT <0.2 11/26/2018   ALKPHOS 109 11/26/2018   AST 19 11/26/2018   ALT 15 11/26/2018   PROT 7.1 11/26/2018   ALBUMIN 4.2 11/26/2018   CALCIUM 8.8 11/26/2018   ANIONGAP 8 05/04/2016   GFR 128.50 08/14/2014   Lab Results  Component Value Date   CHOL 202 (H) 11/26/2018   Lab Results  Component Value Date   HDL 71 11/26/2018   Lab Results  Component Value Date   LDLCALC 114 (H) 11/26/2018   Lab  Results  Component Value Date   TRIG 84 11/26/2018   Lab Results  Component Value Date   CHOLHDL 2.8 11/26/2018   Lab Results  Component Value Date   HGBA1C 5.5 11/26/2018      Assessment & Plan:   Problem List Items Addressed This Visit    None  Visit Diagnoses    Vaginal irritation    -  Primary Patient advised to avoid irritants, hot water or fragrant soaps No more vaginal steaming as steam can cause thermal burn Given lotrisone to use and discussed expected course   Relevant Medications   clotrimazole-betamethasone (LOTRISONE) cream   Other Relevant Orders   POCT Wet + KOH Prep (Completed)   Encounter for medication refill    -  Refilled medications after reviewing labs since pt is currently stable on current doses      Meds ordered this encounter  Medications  . clotrimazole-betamethasone (LOTRISONE) cream    Sig: Apply 1 application topically 2 (two) times daily.    Dispense:  30 g    Refill:  0    Follow-up: No follow-ups on file.    Doristine BosworthZoe A Fredric Slabach, MD

## 2018-12-26 NOTE — Patient Instructions (Addendum)
If you have lab work done today you will be contacted with your lab results within the next 2 weeks.  If you have not heard from [place] then please contact us. The fastest way to get your results is to register for My Chart.   IF you received an x-ray today, you will receive an invoice from [facility]. Please contact [facility] at [phone] with questions or concerns regarding your invoice.   IF you received labwork today, you will receive an invoice from [facility]. Please contact [facility] at [phone] with questions or concerns regarding your invoice.   Our billing staff will not be able to assist you with questions regarding bills from these companies.  You will be contacted with the lab results as soon as they are available. The fastest way to get your results is to activate your My Chart account. Instructions are located on the last page of this paperwork. If you have not heard from [place] regarding the results in 2 weeks, please contact this office.     Vaginitis Vaginitis is a condition in which the vaginal tissue swells and becomes red (inflamed). This condition is most often caused by a change in the normal balance of bacteria and yeast that live in the vagina. This change causes an overgrowth of certain bacteria or yeast, which causes the inflammation. There are different types of vaginitis, but the most common types are:  Bacterial vaginosis.  Yeast infection (candidiasis).  Trichomoniasis vaginitis. This is a sexually transmitted disease (STD).  Viral vaginitis.  Atrophic vaginitis.  Allergic vaginitis. What are the causes? The cause of this condition depends on the type of vaginitis. It can be caused by:  Bacteria (bacterial vaginosis).  Yeast, which is a fungus (yeast infection).  A parasite (trichomoniasis vaginitis).  A virus (viral vaginitis).  Low hormone levels (atrophic vaginitis). Low hormone levels can occur during pregnancy,  breastfeeding, or after menopause.  Irritants, such as bubble baths, scented tampons, and feminine sprays (allergic vaginitis). Other factors can change the normal balance of the yeast and bacteria that live in the vagina. These include:  Antibiotic medicines.  Poor hygiene.  Diaphragms, vaginal sponges, spermicides, birth control pills, and intrauterine devices (IUD).  Sex.  Infection.  Uncontrolled diabetes.  A weakened defense (immune) system. What increases the risk? This condition is more likely to develop in women who:  Smoke.  Use vaginal douches, scented tampons, or scented sanitary pads.  Wear tight-fitting pants.  Wear thong underwear.  Use oral birth control pills or an IUD.  Have sex without a condom.  Have multiple sex partners.  Have an STD.  Frequently use the spermicide nonoxynol-9.  Eat lots of foods high in sugar.  Have uncontrolled diabetes.  Have low estrogen levels.  Have a weakened immune system from an immune disorder or medical treatment.  Are pregnant or breastfeeding. What are the signs or symptoms? Symptoms vary depending on the cause of the vaginitis. Common symptoms include:  Abnormal vaginal discharge. ? The discharge is white, gray, or yellow with bacterial vaginosis. ? The discharge is thick, white, and cheesy with a yeast infection. ? The discharge is frothy and yellow or greenish with trichomoniasis.  A bad vaginal smell. The smell is fishy with bacterial vaginosis.  Vaginal itching, pain, or swelling.  Sex that is painful.  Pain or burning when urinating. Sometimes there are no symptoms. How is this diagnosed? This condition is diagnosed based on your symptoms and medical history. A physical  exam, including a pelvic exam, will also be done. You may also have other tests, including:  Tests to determine the pH level (acidity or alkalinity) of your vagina.  A whiff test, to assess the odor that results when a sample  of your vaginal discharge is mixed with a potassium hydroxide solution.  Tests of vaginal fluid. A sample will be examined under a microscope. How is this treated? Treatment varies depending on the type of vaginitis you have. Your treatment may include:  Antibiotic creams or pills to treat bacterial vaginosis and trichomoniasis.  Antifungal medicines, such as vaginal creams or suppositories, to treat a yeast infection.  Medicine to ease discomfort if you have viral vaginitis. Your sexual partner should also be treated.  Estrogen delivered in a cream, pill, suppository, or vaginal ring to treat atrophic vaginitis. If vaginal dryness occurs, lubricants and moisturizing creams may help. You may need to avoid scented soaps, sprays, or douches.  Stopping use of a product that is causing allergic vaginitis. Then using a vaginal cream to treat the symptoms. Follow these instructions at home: Lifestyle  Keep your genital area clean and dry. Avoid soap, and only rinse the area with water.  Do not douche or use tampons until your health care provider says it is okay to do so. Use sanitary pads, if needed.  Do not have sex until your health care provider approves. When you can return to sex, practice safe sex and use condoms.  Wipe from front to back. This avoids the spread of bacteria from the rectum to the vagina. General instructions  Take over-the-counter and prescription medicines only as told by your health care provider.  If you were prescribed an antibiotic medicine, take or use it as told by your health care provider. Do not stop taking or using the antibiotic even if you start to feel better.  Keep all follow-up visits as told by your health care provider. This is important. How is this prevented?  Use mild, non-scented products. Do not use things that can irritate the vagina, such as fabric softeners. Avoid the following products if they are scented: ? Feminine  sprays. ? Detergents. ? Tampons. ? Feminine hygiene products. ? Soaps or bubble baths.  Let air reach your genital area. ? Wear cotton underwear to reduce moisture buildup. ? Avoid wearing underwear while you sleep. ? Avoid wearing tight pants and underwear or nylons without a cotton panel. ? Avoid wearing thong underwear.  Take off any wet clothing, such as bathing suits, as soon as possible.  Practice safe sex and use condoms. Contact a health care provider if:  You have abdominal pain.  You have a fever.  You have symptoms that last for more than 2-3 days. Get help right away if:  You have a fever and your symptoms suddenly get worse. Summary  Vaginitis is a condition in which the vaginal tissue becomes inflamed.This condition is most often caused by a change in the normal balance of bacteria and yeast that live in the vagina.  Treatment varies depending on the type of vaginitis you have.  Do not douche, use tampons , or have sex until your health care provider approves. When you can return to sex, practice safe sex and use condoms. This information is not intended to replace advice given to you by your health care provider. Make sure you discuss any questions you have with your health care provider. Document Released: 02/27/2007 Document Revised: 04/14/2017 Document Reviewed: 06/07/2016 Elsevier Patient Education  2020 [employer]  

## 2019-03-17 DIAGNOSIS — U071 COVID-19: Secondary | ICD-10-CM

## 2019-03-17 HISTORY — DX: COVID-19: U07.1

## 2019-03-18 ENCOUNTER — Encounter: Payer: Self-pay | Admitting: *Deleted

## 2019-03-18 ENCOUNTER — Ambulatory Visit: Payer: Self-pay

## 2019-03-18 NOTE — Telephone Encounter (Signed)
Incoming call from Patient who states that she has covid-19.  Patient reports having a moderate to severe headache.   Onset was yesterday.  Encouraged Pt. To increase water intake and may con continue to take  The extra strength Tyelenol.   For aches and fever. Patient voices understanding.              Reason for Disposition . [1] SEVERE headache AND [2] sudden-onset (i.e., reaching maximum intensity within seconds)  Answer Assessment - Initial Assessment Questions 1. LOCATION: "Where does it hurt?"      Right side eyes to back  2. ONSET: "When did the headache start?" (Minutes, hours or days)     yesterday 3. PATTERN: "Does the pain come and go, or has it been constant since it started?"    Comes and goes 4. SEVERITY: "How bad is the pain?" and "What does it keep you from doing?"  (e.g., Scale 1-10; mild, moderate, or severe)   - MILD (1-3): doesn't interfere with normal activities    - MODERATE (4-7): interferes with normal activities or awakens from sleep    - SEVERE (8-10): excruciating pain, unable to do any normal activities       severe 5. RECURRENT SYMPTOM: "Have you ever had headaches before?" If so, ask: "When was the last time?" and "What happened that time?"      6. CAUSE: "What do you think is causing the headache?"    Side effect of covid 7. MIGRAINE: "Have you been diagnosed with migraine headaches?" If so, ask: "Is this headache similar?"      *No Answer* 8. HEAD INJURY: "Has there been any recent injury to the head?"      *No Answer* 9. OTHER SYMPTOMS: "Do you have any other symptoms?" (fever, stiff neck, eye pain, sore throat, cold symptoms)    denies 10. PREGNANCY: "Is there any chance you are pregnant?" "When was your last menstrual period?"       2 weeks ago.  Protocols used: HEADACHE-A-AH

## 2019-03-18 NOTE — Telephone Encounter (Signed)
This encounter was created in error - please disregard.

## 2019-03-20 ENCOUNTER — Encounter (HOSPITAL_COMMUNITY): Payer: Self-pay | Admitting: Emergency Medicine

## 2019-03-20 ENCOUNTER — Emergency Department (HOSPITAL_COMMUNITY)
Admission: EM | Admit: 2019-03-20 | Discharge: 2019-03-20 | Disposition: A | Discharge status: Home / Self Care | Payer: Managed Care, Other (non HMO) | Attending: Emergency Medicine | Admitting: Emergency Medicine

## 2019-03-20 ENCOUNTER — Other Ambulatory Visit: Payer: Self-pay

## 2019-03-20 ENCOUNTER — Emergency Department (HOSPITAL_COMMUNITY): Payer: Managed Care, Other (non HMO)

## 2019-03-20 DIAGNOSIS — Z87891 Personal history of nicotine dependence: Secondary | ICD-10-CM | POA: Diagnosis not present

## 2019-03-20 DIAGNOSIS — R55 Syncope and collapse: Secondary | ICD-10-CM | POA: Diagnosis not present

## 2019-03-20 DIAGNOSIS — Z8709 Personal history of other diseases of the respiratory system: Secondary | ICD-10-CM | POA: Insufficient documentation

## 2019-03-20 DIAGNOSIS — R0789 Other chest pain: Secondary | ICD-10-CM | POA: Insufficient documentation

## 2019-03-20 DIAGNOSIS — Z793 Long term (current) use of hormonal contraceptives: Secondary | ICD-10-CM | POA: Diagnosis not present

## 2019-03-20 DIAGNOSIS — R0602 Shortness of breath: Secondary | ICD-10-CM | POA: Diagnosis not present

## 2019-03-20 DIAGNOSIS — Z8616 Personal history of COVID-19: Secondary | ICD-10-CM

## 2019-03-20 DIAGNOSIS — R509 Fever, unspecified: Secondary | ICD-10-CM | POA: Insufficient documentation

## 2019-03-20 DIAGNOSIS — Z79899 Other long term (current) drug therapy: Secondary | ICD-10-CM | POA: Diagnosis not present

## 2019-03-20 DIAGNOSIS — R519 Headache, unspecified: Secondary | ICD-10-CM | POA: Insufficient documentation

## 2019-03-20 DIAGNOSIS — Z8619 Personal history of other infectious and parasitic diseases: Secondary | ICD-10-CM

## 2019-03-20 LAB — CBC WITH DIFFERENTIAL/PLATELET
Abs Immature Granulocytes: 0.01 10*3/uL (ref 0.00–0.07)
Basophils Absolute: 0 10*3/uL (ref 0.0–0.1)
Basophils Relative: 0 %
Eosinophils Absolute: 0 10*3/uL (ref 0.0–0.5)
Eosinophils Relative: 0 %
HCT: 41.4 % (ref 36.0–46.0)
Hemoglobin: 13.5 g/dL (ref 12.0–15.0)
Immature Granulocytes: 0 %
Lymphocytes Relative: 26 %
Lymphs Abs: 0.7 10*3/uL (ref 0.7–4.0)
MCH: 29 pg (ref 26.0–34.0)
MCHC: 32.6 g/dL (ref 30.0–36.0)
MCV: 88.8 fL (ref 80.0–100.0)
Monocytes Absolute: 0.3 10*3/uL (ref 0.1–1.0)
Monocytes Relative: 10 %
Neutro Abs: 1.8 10*3/uL (ref 1.7–7.7)
Neutrophils Relative %: 64 %
Platelets: 224 10*3/uL (ref 150–400)
RBC: 4.66 MIL/uL (ref 3.87–5.11)
RDW: 13.2 % (ref 11.5–15.5)
WBC: 2.7 10*3/uL — ABNORMAL LOW (ref 4.0–10.5)
nRBC: 0 % (ref 0.0–0.2)

## 2019-03-20 LAB — CBG MONITORING, ED: Glucose-Capillary: 78 mg/dL (ref 70–99)

## 2019-03-20 LAB — COMPREHENSIVE METABOLIC PANEL
ALT: 20 U/L (ref 0–44)
AST: 30 U/L (ref 15–41)
Albumin: 3.5 g/dL (ref 3.5–5.0)
Alkaline Phosphatase: 88 U/L (ref 38–126)
Anion gap: 11 (ref 5–15)
BUN: 6 mg/dL (ref 6–20)
CO2: 24 mmol/L (ref 22–32)
Calcium: 8.5 mg/dL — ABNORMAL LOW (ref 8.9–10.3)
Chloride: 103 mmol/L (ref 98–111)
Creatinine, Ser: 0.83 mg/dL (ref 0.44–1.00)
GFR calc Af Amer: >60 mL/min (ref >60)
GFR calc non Af Amer: >60 mL/min (ref >60)
Glucose, Bld: 93 mg/dL (ref 70–99)
Potassium: 4.1 mmol/L (ref 3.5–5.1)
Sodium: 138 mmol/L (ref 135–145)
Total Bilirubin: 0.4 mg/dL (ref 0.3–1.2)
Total Protein: 7.2 g/dL (ref 6.5–8.1)

## 2019-03-20 LAB — TROPONIN I (HIGH SENSITIVITY): Troponin I (High Sensitivity): <2 ng/L (ref <18)

## 2019-03-20 MED ORDER — SODIUM CHLORIDE 0.9 % IV BOLUS
1000.0000 mL | Freq: Once | INTRAVENOUS | Status: AC
Start: 2019-03-20 — End: 2019-03-20
  Administered 2019-03-20: 1000 mL via INTRAVENOUS

## 2019-03-20 MED ORDER — DIPHENHYDRAMINE HCL 50 MG/ML IJ SOLN
12.5000 mg | Freq: Once | INTRAMUSCULAR | Status: AC
  Administered 2019-03-20: 12.5 mg via INTRAVENOUS
  Filled 2019-03-20: qty 1

## 2019-03-20 MED ORDER — ACETAMINOPHEN 325 MG PO TABS
650.0000 mg | ORAL_TABLET | Freq: Once | ORAL | Status: AC
  Administered 2019-03-20: 650 mg via ORAL
  Filled 2019-03-20: qty 2

## 2019-03-20 MED ORDER — IOHEXOL 350 MG/ML SOLN
100.0000 mL | Freq: Once | INTRAVENOUS | Status: AC | PRN
  Administered 2019-03-20: 20:00:00 100 mL via INTRAVENOUS

## 2019-03-20 MED ORDER — PROCHLORPERAZINE EDISYLATE 10 MG/2ML IJ SOLN
10.0000 mg | Freq: Once | INTRAMUSCULAR | Status: AC
  Administered 2019-03-20: 10 mg via INTRAVENOUS
  Filled 2019-03-20: qty 2

## 2019-03-20 NOTE — Discharge Instructions (Addendum)
Please follow up with your primary care provider within 5-7 days for re-evaluation of your symptoms. If you do not have a primary care provider, information for a healthcare clinic has been provided for you to make arrangements for follow up care. Please return to the emergency department for any new or worsening symptoms. ° °

## 2019-03-20 NOTE — ED Triage Notes (Signed)
Pt reports diagnosed COVID + last Tuesday, has been having headaches, blacked out today. Noted to be tachy in triage. C/o low back pain, denies dysuria.

## 2019-03-20 NOTE — ED Provider Notes (Signed)
[facility] EMERGENCY DEPARTMENT Provider Note   CSN: [id number] Arrival date & time: 03/20/19  1355     History   Chief Complaint Chief Complaint  Patient presents with   covid/ syncope    HPI Joyce [name] is a 48 y.o. female.     HPI   Patient is a 48 year old female with a history of anxiety, dehydration, elevated blood pressure, GERD, IBS, who presents the emergency department today for evaluation of a syncopal episode.  Patient states that she started having Covid symptoms about 10 days ago and was diagnosed with Covid recently.  States that today she was sitting at the table when she lost consciousness.  States prior to this she felt like she needed to eat/drink something because she had not done so all day. She was also very anxious because she was reading about sxs of COVID. States that when she regained consciousness she had a bit of a headache, she does have history of migraines.  She also reports that following this she experienced chest pain and shortness of breath for a few minutes. sxs now resolved, however she states she has a "funny feeling "when she breathes.  She also notes she has not really eaten or drink anything today.  She denies any cough, nausea, vomiting, diarrhea, abdominal pain.  She has had some low back pain that radiates to the right lower extremity pain since she has been sitting more while being sick.  She also reports that she has a history of syncopal episode while she was driving in the past that has not been worked up.  She denies any early family history of cardiac disease or sudden cardiac death.  Denies any visual changes, unilateral numbness/weakness.  Past Medical History:  Diagnosis Date   Anxiety state 03/15/2014   Dehydration 03/15/2014   DYSURIA, CHRONIC    Elevated BP 03/15/2014   GENITAL HERPES    GERD (gastroesophageal reflux disease)    Headache(784.0)    IBS    Palpitations 03/15/2014    Patient  Active Problem List   Diagnosis Date Noted   Acute upper respiratory infection 05/27/2015   Rash of face 09/26/2014   Routine general medical examination at a health care facility 08/22/2014   Anxiety state 03/15/2014   Elevated BP 03/15/2014   GERD (gastroesophageal reflux disease) 06/07/2013   VAGINITIS, CANDIDAL 03/18/2010   GENITAL HERPES 02/04/2010   POLYURIA 02/04/2010   TOBACCO USE, QUIT 02/04/2010   IBS 05/30/2008   Constipation 05/28/2008    Past Surgical History:  Procedure Laterality Date   BREAST LUMPECTOMY Left    TUBAL LIGATION  2003     OB History   No obstetric history on file.      Home Medications    Prior to Admission medications   Medication Sig Start Date End Date Taking? Authorizing Provider  cetirizine (ZYRTEC) 10 MG tablet Take 10 mg by mouth daily.    [provider]  clotrimazole-betamethasone (LOTRISONE) cream Apply 1 application topically 2 (two) times daily. 12/26/18   Doristine Bosworth, MD  Multiple Vitamin (MULTIVITAMIN) tablet Take 1 tablet by mouth daily.      [provider]  norgestimate-ethinyl estradiol (ESTARYLLA) 0.25-35 MG-MCG tablet Take 1 tablet by mouth daily.    [provider]    Family History Family History  Problem Relation Age of Onset   Hypertension Mother    Irritable bowel syndrome Father     Social History Social History  Tobacco Use   Smoking status: Former Smoker    Types: Cigarettes   Smokeless tobacco: Never Used   Tobacco comment: Only smokes when she is stress. Married, lives with spouse  Substance Use Topics   Alcohol use: Yes    Comment: 1 glass at bedtime   Drug use: No     Allergies   Peach [prunus persica] and Penicillins   Review of Systems Review of Systems  Constitutional: Positive for diaphoresis and fever. Negative for chills.  HENT: Negative for ear pain and sore throat.   Eyes: Negative for visual disturbance.  Respiratory: Positive  for shortness of breath. Negative for cough.   Cardiovascular: Positive for chest pain. Negative for palpitations.  Gastrointestinal: Negative for abdominal pain, constipation, diarrhea, nausea and vomiting.  Genitourinary: Negative for dysuria.       No loss of control of bowel or bladder function  Musculoskeletal: Positive for back pain. Negative for arthralgias.  Skin: Negative for rash.  Neurological: Positive for syncope and headaches. Negative for dizziness, weakness, light-headedness and numbness.  All other systems reviewed and are negative.    Physical Exam Updated Vital Signs BP (!) 149/93    Pulse (!) 107    Temp 99.2 F (37.3 C)    Resp 19    SpO2 100%   Physical Exam Vitals signs and nursing note reviewed.  Constitutional:      General: She is not in acute distress.    Appearance: She is well-developed.  HENT:     Head: Normocephalic and atraumatic.  Eyes:     Conjunctiva/sclera: Conjunctivae normal.  Neck:     Musculoskeletal: Neck supple.  Cardiovascular:     Rate and Rhythm: Regular rhythm. Tachycardia present.     Heart sounds: No murmur.  Pulmonary:     Effort: Pulmonary effort is normal. No respiratory distress.     Breath sounds: Normal breath sounds. No wheezing, rhonchi or rales.  Abdominal:     General: Bowel sounds are normal. There is no distension.     Palpations: Abdomen is soft.     Tenderness: There is no abdominal tenderness. There is no guarding or rebound.  Skin:    General: Skin is warm and dry.  Neurological:     Mental Status: She is alert.     Comments: Mental Status:  Alert, thought content appropriate, able to give a coherent history. Speech fluent without evidence of aphasia. Able to follow 2 step commands without difficulty.  Cranial Nerves:  II:   pupils equal, round, reactive to light III,IV, VI: ptosis not present, extra-ocular motions intact bilaterally  V,VII: smile symmetric, facial light touch sensation equal VIII: hearing  grossly normal to voice  X: uvula elevates symmetrically  XI: bilateral shoulder shrug symmetric and strong XII: midline tongue extension without fassiculations Motor:  Normal tone. 5/5 strength of BUE and BLE major muscle groups including strong and equal grip strength and dorsiflexion/plantar flexion Sensory: light touch normal in all extremities.     ED Treatments / Results  Labs (all labs ordered are listed, but only abnormal results are displayed) Labs Reviewed  CBC WITH DIFFERENTIAL/PLATELET - Abnormal; Notable for the following components:      Result Value   WBC 2.7 (*)    All other components within normal limits  COMPREHENSIVE METABOLIC PANEL - Abnormal; Notable for the following components:   Calcium 8.5 (*)    All other components within normal limits  CBG MONITORING, ED  TROPONIN I (HIGH SENSITIVITY)  TROPONIN I (HIGH SENSITIVITY)    EKG EKG Interpretation  Date/Time:  Wednesday March 20 2019 14:41:59 EST Ventricular Rate:  115 PR Interval:  146 QRS Duration: 72 QT Interval:  330 QTC Calculation: 456 R Axis:   89 Text Interpretation: Sinus tachycardia Otherwise normal ECG Confirmed by Lorre NickAllen, Anthony ([id number]) on 03/20/2019 5:37:33 PM   Radiology Ct Angio Chest Pe W/cm &/or Wo Cm  Result Date: 03/20/2019 CLINICAL DATA:  Recently diagnosed with COVID.  Shortness of breath. EXAM: CT ANGIOGRAPHY CHEST WITH CONTRAST TECHNIQUE: Multidetector CT imaging of the chest was performed using the standard protocol during bolus administration of intravenous contrast. Multiplanar CT image reconstructions and MIPs were obtained to evaluate the vascular anatomy. CONTRAST:  100mL OMNIPAQUE IOHEXOL 350 MG/ML SOLN COMPARISON:  None. FINDINGS: Cardiovascular: No filling defects in the pulmonary arteries to suggest pulmonary emboli. Heart is normal size. Aorta is normal caliber. Mediastinum/Nodes: No mediastinal, hilar, or axillary adenopathy. Trachea and esophagus are unremarkable.  Thyroid unremarkable. Lungs/Pleura: Patchy ground-glass opacities dependently in the lower lobes and posterior right upper lobe. These could reflect early infiltrates or atelectasis. No effusions. Upper Abdomen: Imaging into the upper abdomen shows no acute findings. Musculoskeletal: Chest wall soft tissues are unremarkable. No acute bony abnormality. Review of the MIP images confirms the above findings. IMPRESSION: No evidence of pulmonary embolus. Patchy bilateral predominantly dependent ground-glass opacities in the lungs. These could reflect early infiltrate/pneumonia, and COVID pneumonia could have this appearance. The dependent nature suggest these could also reflect atelectasis. No effusions. Electronically Signed   By: Charlett NoseKevin  Dover M.D.   On: 03/20/2019 20:29    Procedures Procedures (including critical care time)  Medications Ordered in ED Medications  sodium chloride 0.9 % bolus 1,000 mL (1,000 mLs Intravenous New Bag/Given 03/20/19 1822)  acetaminophen (TYLENOL) tablet 650 mg (650 mg Oral Given 03/20/19 1821)  prochlorperazine (COMPAZINE) injection 10 mg (10 mg Intravenous Given 03/20/19 1817)  diphenhydrAMINE (BENADRYL) injection 12.5 mg (12.5 mg Intravenous Given 03/20/19 1819)  iohexol (OMNIPAQUE) 350 MG/ML injection 100 mL (100 mLs Intravenous Contrast Given 03/20/19 2010)     Initial Impression / Assessment and Plan / ED Course  I have reviewed the triage vital signs and the nursing notes.  Pertinent labs & imaging results that were available during my care of the patient were reviewed by me and considered in my medical decision making (see chart for details).     Final Clinical Impressions(s) / ED Diagnoses   Final diagnoses:  Syncope, unspecified syncope type  History of 2019 novel coronavirus disease (COVID-5419)   48 year old Covid positive female presenting for evaluation of syncopal episode that occurred while sitting prior to arrival.  Upon regaining consciousness she  complained of chest pain, shortness of breath.  She did also have a headache.  She does have a history of migraines.  She is having an abnormal sensation when she breathes as well.  On arrival, tachycardic, afebrile with otherwise normal vital signs.  Neurologically intact.  Lungs clear to auscultation bilaterally.  No murmurs on exam.  CBC with leukopenia consistent with known diagnosis of Covid.  No anemia CMP nonacute Initial troponin negative  CTA w/o PE, but with findings consistent with COVID 19.   Reassessed pt. She has received 1L IVF and is still somewhat tachycardic. Her HA is gone. Denies CP/SOB. I discussed the results thus far and plan to obtain delta trop and give additional fluids given her persistent tachycardia. Pt states she would like to be discharged. I discussed  reason for obtaining delta trop and she prefers to decline this test and be discharged.  She states if sxs recur or if she feels worse she will return to the ED. I did recommend close f/u with her PCP. She agrees to f/u as directed.   -----  Audry Pili was evaluated in Emergency Department on 03/20/2019 for the symptoms described in the history of present illness. She was evaluated in the context of the global COVID-19 pandemic, which necessitated consideration that the patient might be at risk for infection with the SARS-CoV-2 virus that causes COVID-19. Institutional protocols and algorithms that pertain to the evaluation of patients at risk for COVID-19 are in a state of rapid change based on information released by regulatory bodies including the CDC and federal and state organizations. These policies and algorithms were followed during the patient's care in the ED.   ED Discharge Orders    None       Rayne Du 03/20/19 2108    Lorre Nick, MD 03/21/19 1318

## 2019-03-25 ENCOUNTER — Telehealth: Payer: Self-pay | Admitting: Family Medicine

## 2019-03-25 DIAGNOSIS — U071 COVID-19: Secondary | ICD-10-CM

## 2019-03-25 NOTE — Telephone Encounter (Signed)
Pt requesting cb from cma or nurse. Did not disclose reason

## 2019-03-25 NOTE — Telephone Encounter (Signed)
03/25/2019 - PATIENT CALLED BACK A 2nd TIME. SHE SAID SHE BLACKED OUT ON WED. AND WENT TO [place]. AND WAS POSITIVE FOR A COVID TEST AND SOME PNEUMONIA. SHE WAS GIVEN A CAT SCAN DUE TO HER BREATHING AND HER BLOOD PRESSURE BEING ELEVATED. SHE IS TO HAVE ANOTHER COVID TEST ON Friday. THEY TOLD HER TO CALL DR. Nolon Rod. HER HUSBAND WAS GIVEN A AERO FLOW CHAMBER AND SHE WOULD LIKE TO HAVE THIS CALLED INTO HER PHARMACY BEFORE HER DRUG STORE CLOSES AT 8:00pm TONIGHT. SHE SAID HER HIP ALSO HURTS. THIS PATIENT TOLD ME A LOT MORE INFORMATION WHICH I COULD NOT WRITE IT ALL DOWN. SHE WANTS A CALL FROM DR. STALLINGS AS SOON AS POSSIBE. BEST PHONE [phone] (CELL) PHARMACY CHOICE IS [facility] ON [address]. [place]

## 2019-03-26 ENCOUNTER — Telehealth: Payer: Self-pay | Admitting: Family Medicine

## 2019-03-26 MED ORDER — ALBUTEROL SULFATE HFA 108 (90 BASE) MCG/ACT IN AERS: 2.0000 | INHALATION_SPRAY | Freq: Four times a day (QID) | RESPIRATORY_TRACT | 1 refills | Status: AC | PRN

## 2019-03-26 MED ORDER — BLOOD PRESSURE MONITOR DEVI: 0 refills | Status: AC

## 2019-03-26 NOTE — Telephone Encounter (Signed)
Patient calling and states that pharmacy denied filling this, possible due to insurance purposes. Would like a generic or different inhaler sent to the pharmacy. Please advise.   [place], [place]

## 2019-03-26 NOTE — Telephone Encounter (Signed)
Spoke with patient about her hospital evaluation Prescribed pt albuterol and a blood pressure monitor Pt advised to follow up with vitals tomorrow during virtual visit

## 2019-03-27 ENCOUNTER — Encounter: Payer: Self-pay | Admitting: Family Medicine

## 2019-03-27 ENCOUNTER — Other Ambulatory Visit: Payer: Self-pay

## 2019-03-27 ENCOUNTER — Telehealth (INDEPENDENT_AMBULATORY_CARE_PROVIDER_SITE_OTHER): Payer: Managed Care, Other (non HMO) | Admitting: Family Medicine

## 2019-03-27 VITALS — BP 141/90 | HR 96 | Temp 98.0°F

## 2019-03-27 DIAGNOSIS — I10 Essential (primary) hypertension: Secondary | ICD-10-CM

## 2019-03-27 DIAGNOSIS — M25551 Pain in right hip: Secondary | ICD-10-CM

## 2019-03-27 MED ORDER — AMLODIPINE BESYLATE 5 MG PO TABS: 2.5000 mg | ORAL_TABLET | Freq: Every day | ORAL | 1 refills | Status: DC

## 2019-03-27 NOTE — Progress Notes (Signed)
COVID Dx Oct 30th.  Hospital 11/4 with SOB and seeing flutters & HA.  Some panic attacks. Requested to referral Cards.   Doing the 2nd COVID test on Friday.

## 2019-03-27 NOTE — Patient Instructions (Signed)
° ° ° °  If you have lab work done today you will be contacted with your lab results within the next 2 weeks.  If you have not heard from us then please contact us. The fastest way to get your results is to register for My Chart. ° ° °IF you received an x-ray today, you will receive an invoice from [facility]. Please contact [facility] at [phone] with questions or concerns regarding your invoice.  ° °IF you received labwork today, you will receive an invoice from [facility]. Please contact [facility] at [phone] with questions or concerns regarding your invoice.  ° °Our billing staff will not be able to assist you with questions regarding bills from these companies. ° °You will be contacted with the lab results as soon as they are available. The fastest way to get your results is to activate your My Chart account. Instructions are located on the last page of this paperwork. If you have not heard from us regarding the results in 2 weeks, please contact this office. °  ° ° ° °

## 2019-03-27 NOTE — Progress Notes (Signed)
VIDEO Encounter- SOAP NOTE Established Patient  This VIDEO encounter was conducted with the patient's (or proxy's) verbal consent via audio telecommunications: yes/no: Yes Patient was instructed to have this encounter in a suitably private space; and to only have persons present to whom they give permission to participate. In addition, patient identity was confirmed by use of name plus two identifiers (DOB and address).  I discussed the limitations, risks, security and privacy concerns of performing an evaluation and management service by telephone and the availability of in person appointments. I also discussed with the patient that there may be a patient responsible charge related to this service. The patient expressed understanding and agreed to proceed.  I spent a total of TIME; 0 MIN TO 60 MIN: 30 minutes talking with the patient or their proxy.  CC: hypertension, COVID   Subjective   Joyce [name] is a 48 y.o. established patient. Telephone visit today for  HPI  Positive COVID 03/15/2019  COVID and Hypertension Patient reports that she feels like she was having anxiety attacks She states that she felt lightheaded today with increased heart rate She states that she had a positive for covid and is home on quarantine She would like a referral for Cardiology  She reports that she took her last albuterol at 12pm and had some tea after  BP on 03/26/19 127/91, Pulse 98   BP Readings from Last 3 Encounters:  03/27/19 (!) 141/90  03/20/19 (!) 154/97  12/26/18 128/80    Right hip pain  She says it hurts in her right hip when she sits and thinks it is because she has been in her bed so much She denies any radiating pain Sometimes she feels the pain in the right buttocks  Patient Active Problem List   Diagnosis Date Noted  . Acute upper respiratory infection 05/27/2015  . Rash of face 09/26/2014  . Routine general medical examination at a health care facility 08/22/2014   . Anxiety state 03/15/2014  . Elevated BP 03/15/2014  . GERD (gastroesophageal reflux disease) 06/07/2013  . VAGINITIS, CANDIDAL 03/18/2010  . GENITAL HERPES 02/04/2010  . POLYURIA 02/04/2010  . TOBACCO USE, QUIT 02/04/2010  . IBS 05/30/2008  . Constipation 05/28/2008    Past Medical History:  Diagnosis Date  . Anxiety state 03/15/2014  . Dehydration 03/15/2014  . DYSURIA, CHRONIC   . Elevated BP 03/15/2014  . GENITAL HERPES   . GERD (gastroesophageal reflux disease)   . Headache(784.0)   . IBS   . Palpitations 03/15/2014    Current Outpatient Medications  Medication Sig Dispense Refill  . acetaminophen (TYLENOL) 500 MG tablet Take 1,000 mg by mouth every 6 (six) hours as needed for moderate pain, fever or headache.    . albuterol (VENTOLIN HFA) 108 (90 Base) MCG/ACT inhaler Inhale 2 puffs into the lungs every 6 (six) hours as needed for wheezing or shortness of breath. 18 g 1  . Blood Pressure Monitor DEVI Use to check blood pressures daily for hypertension and monitoring vitals at home due to COVID 1 Device 0  . cetirizine (ZYRTEC) 10 MG tablet Take 10 mg by mouth daily.    Marland Kitchen ELDERBERRY PO Take 1 Dose by mouth daily.    . Multiple Vitamin (MULTIVITAMIN) tablet Take 1 tablet by mouth daily.      . norgestimate-ethinyl estradiol (ESTARYLLA) 0.25-35 MG-MCG tablet Take 1 tablet by mouth daily.    Marland Kitchen Phenylephrine-DM-GG-APAP (MUCINEX FAST-MAX COLD FLU PO) Take 10 mLs by mouth  as needed (cold and flu). Liquid    . amLODipine (NORVASC) 5 MG tablet Take 0.5 tablets (2.5 mg total) by mouth daily. 30 tablet 1  . clotrimazole-betamethasone (LOTRISONE) cream Apply 1 application topically 2 (two) times daily. (Patient not taking: Reported on 03/20/2019) 30 g 0   No current facility-administered medications for this visit.     Allergies  Allergen Reactions  . Peach [Prunus Persica] Itching and Rash  . Penicillins Itching and Rash    Social History   Socioeconomic History  .  Marital status: Married    Spouse name: Not on file  . Number of children: 3  . Years of education: Not on file  . Highest education level: Not on file  Occupational History  . Occupation: Nutritional therapist: [employer]  Social Needs  . Financial resource strain: Not on file  . Food insecurity    Worry: Not on file    Inability: Not on file  . Transportation needs    Medical: Not on file    Non-medical: Not on file  Tobacco Use  . Smoking status: Former Smoker    Types: Cigarettes  . Smokeless tobacco: Never Used  . Tobacco comment: Only smokes when she is stress. Married, lives with spouse  Substance and Sexual Activity  . Alcohol use: Yes    Comment: 1 glass at bedtime  . Drug use: No  . Sexual activity: Not on file  Lifestyle  . Physical activity    Days per week: Not on file    Minutes per session: Not on file  . Stress: Not on file  Relationships  . Social Musician on phone: Not on file    Gets together: Not on file    Attends religious service: Not on file    Active member of club or organization: Not on file    Attends meetings of clubs or organizations: Not on file    Relationship status: Not on file  . Intimate partner violence    Fear of current or ex partner: Not on file    Emotionally abused: Not on file    Physically abused: Not on file    Forced sexual activity: Not on file  Other Topics Concern  . Not on file  Social History Narrative  . Not on file    ROS Review of Systems  Constitutional: Negative for activity change, appetite change, chills and fever.  HENT: Negative for congestion, nosebleeds, trouble swallowing and voice change.   Respiratory: see hpi   Gastrointestinal: Negative for diarrhea, nausea and vomiting.  Genitourinary: Negative for difficulty urinating, dysuria, flank pain and hematuria.  Musculoskeletal: right hip pain  Neurological: Negative for dizziness, speech difficulty, light-headedness and  numbness.  See HPI. All other review of systems negative.   Objective   Vitals as reported by the patient: Today's Vitals   03/27/19 1429  BP: (!) 141/90  Pulse: 96  Temp: 98 F (36.7 C)   Head traumatic, normocephalic EOM intact, normal conjunctiva Normal pulmonary effort Normal speech and affect   Diagnoses and all orders for this visit:  Essential hypertension -     amLODipine (NORVASC) 5 MG tablet; Take 0.5 tablets (2.5 mg total) by mouth daily.  new diagnosis of hypertension Added amlodipine No lisinopril since there is a side effect of a cough No beta blocker since she has shortness of breath and some lightheadedness  COVID positive Discussed covid testing Will get retested 03/29/2019  Right hip pain  Use asperceme with lidocaine, tylenol arthritis for her pain   To be cleared to return to work then call for office visit on Monday 04/01/2019 for a virtual visit.   One month follow for hypertension    I discussed the assessment and treatment plan with the patient. The patient was provided an opportunity to ask questions and all were answered. The patient agreed with the plan and demonstrated an understanding of the instructions.   The patient was advised to call back or seek an in-person evaluation if the symptoms worsen or if the condition fails to improve as anticipated.  I provided 30 minutes of face-to-face time during this encounter.  Doristine BosworthZoe A Laurieanne Galloway, MD  [facility] at [facility]

## 2019-03-29 ENCOUNTER — Telehealth: Payer: Self-pay | Admitting: Family Medicine

## 2019-03-29 NOTE — Telephone Encounter (Signed)
Pt dropped off FMLA paperwork and disabilty paperwork . Put paperwork in proviider  Box  at [place]

## 2019-03-30 NOTE — Telephone Encounter (Signed)
Please let her know albuterol is a generic.  She can try goodrx.com for a coupon.

## 2019-04-02 ENCOUNTER — Telehealth: Payer: Self-pay | Admitting: Family Medicine

## 2019-04-02 ENCOUNTER — Telehealth (INDEPENDENT_AMBULATORY_CARE_PROVIDER_SITE_OTHER): Payer: Managed Care, Other (non HMO) | Admitting: Family Medicine

## 2019-04-02 ENCOUNTER — Encounter: Payer: Self-pay | Admitting: *Deleted

## 2019-04-02 ENCOUNTER — Other Ambulatory Visit: Payer: Self-pay

## 2019-04-02 ENCOUNTER — Encounter: Payer: Self-pay | Admitting: Family Medicine

## 2019-04-02 NOTE — Progress Notes (Signed)
CC- 5 day f/u on bp- just started on bp med last week.  Yesterday blood pressure was 112/78 hr 92. Bp on 03/28/19 -120/82 hr 95. Patient was tested for covid on  12/05/18 test was negative. But took another test on 03/29/19 have not heard back. Taking blood pressure meds every other day.

## 2019-04-02 NOTE — Telephone Encounter (Signed)
Mychart message sent.

## 2019-04-02 NOTE — Telephone Encounter (Signed)
Copied from [place] [id number]. Topic: Appointment Scheduling - Scheduling Inquiry for Clinic >> Apr 02, 2019 12:12 PM Oneta Rack wrote: Patient went to bathroom and missed Dr. Nolon Rod call at 12:05pm patient had appt at 11:20am.

## 2019-04-03 ENCOUNTER — Telehealth (INDEPENDENT_AMBULATORY_CARE_PROVIDER_SITE_OTHER): Payer: Managed Care, Other (non HMO) | Admitting: Family Medicine

## 2019-04-03 ENCOUNTER — Other Ambulatory Visit: Payer: Self-pay

## 2019-04-03 ENCOUNTER — Encounter: Payer: Self-pay | Admitting: Family Medicine

## 2019-04-03 VITALS — BP 123/82 | HR 89

## 2019-04-03 DIAGNOSIS — K5909 Other constipation: Secondary | ICD-10-CM

## 2019-04-03 DIAGNOSIS — U071 COVID-19: Secondary | ICD-10-CM

## 2019-04-03 DIAGNOSIS — I10 Essential (primary) hypertension: Secondary | ICD-10-CM

## 2019-04-03 NOTE — Progress Notes (Signed)
Telemedicine Encounter- SOAP NOTE Established Patient  This telephone encounter was conducted with the patient's (or proxy's) verbal consent via audio telecommunications: yes/no: Yes Patient was instructed to have this encounter in a suitably private space; and to only have persons present to whom they give permission to participate. In addition, patient identity was confirmed by use of name plus two identifiers (DOB and address).  I discussed the limitations, risks, security and privacy concerns of performing an evaluation and management service by telephone and the availability of in person appointments. I also discussed with the patient that there may be a patient responsible charge related to this service. The patient expressed understanding and agreed to proceed.  I spent a total of TIME; 0 MIN TO 60 MIN: 25 minutes talking with the patient or their proxy.  CC: covid, hypertension follow up  Subjective   Joyce [name] is a 48 y.o. established patient. Telephone visit today for  HPI Hypertension Pt is taking amlodipine 2.5 mg She is taking her bp daily  BP Readings from Last 3 Encounters:  04/03/19 123/82  04/02/19 121/84  03/27/19 (!) 141/90    covid positive Repeat test pending and due today 04/03/2019 Less fatigue and coughing Albuterol is helping  Constipation She is taking the metamucil but still has constipation It is hard to drink  She describes that she is still staining to have stools   Patient Active Problem List   Diagnosis Date Noted  . Acute upper respiratory infection 05/27/2015  . Rash of face 09/26/2014  . Routine general medical examination at a health care facility 08/22/2014  . Anxiety state 03/15/2014  . Elevated BP 03/15/2014  . GERD (gastroesophageal reflux disease) 06/07/2013  . VAGINITIS, CANDIDAL 03/18/2010  . GENITAL HERPES 02/04/2010  . POLYURIA 02/04/2010  . TOBACCO USE, QUIT 02/04/2010  . IBS 05/30/2008  . Constipation 05/28/2008    Past Medical History:  Diagnosis Date  . Anxiety state 03/15/2014  . Dehydration 03/15/2014  . DYSURIA, CHRONIC   . Elevated BP 03/15/2014  . GENITAL HERPES   . GERD (gastroesophageal reflux disease)   . Headache(784.0)   . IBS   . Palpitations 03/15/2014    Current Outpatient Medications  Medication Sig Dispense Refill  . acetaminophen (TYLENOL) 500 MG tablet Take 1,000 mg by mouth every 6 (six) hours as needed for moderate pain, fever or headache.    . albuterol (VENTOLIN HFA) 108 (90 Base) MCG/ACT inhaler Inhale 2 puffs into the lungs every 6 (six) hours as needed for wheezing or shortness of breath. 18 g 1  . amLODipine (NORVASC) 5 MG tablet Take 0.5 tablets (2.5 mg total) by mouth daily. 30 tablet 1  . Blood Pressure Monitor DEVI Use to check blood pressures daily for hypertension and monitoring vitals at home due to COVID 1 Device 0  . cetirizine (ZYRTEC) 10 MG tablet Take 10 mg by mouth daily.    . clotrimazole-betamethasone (LOTRISONE) cream Apply 1 application topically 2 (two) times daily. 30 g 0  . ELDERBERRY PO Take 1 Dose by mouth daily.    . Multiple Vitamin (MULTIVITAMIN) tablet Take 1 tablet by mouth daily.      . norgestimate-ethinyl estradiol (ESTARYLLA) 0.25-35 MG-MCG tablet Take 1 tablet by mouth daily.    Marland Kitchen Phenylephrine-DM-GG-APAP (MUCINEX FAST-MAX COLD FLU PO) Take 10 mLs by mouth as needed (cold and flu). Liquid     No current facility-administered medications for this visit.     Allergies  Allergen Reactions  .  Peach [Prunus Persica] Itching and Rash  . Penicillins Itching and Rash    Social History   Socioeconomic History  . Marital status: Married    Spouse name: Not on file  . Number of children: 3  . Years of education: Not on file  . Highest education level: Not on file  Occupational History  . Occupation: Research scientist (physical sciences): [employer]  . Financial resource strain: Not on file  . Food insecurity    Worry:  Not on file    Inability: Not on file  . Transportation needs    Medical: Not on file    Non-medical: Not on file  Tobacco Use  . Smoking status: Former Smoker    Types: Cigarettes  . Smokeless tobacco: Never Used  . Tobacco comment: Only smokes when she is stress. Married, lives with spouse  Substance and Sexual Activity  . Alcohol use: Yes    Comment: 1 glass at bedtime  . Drug use: No  . Sexual activity: Not on file  Lifestyle  . Physical activity    Days per week: Not on file    Minutes per session: Not on file  . Stress: Not on file  Relationships  . Social Herbalist on phone: Not on file    Gets together: Not on file    Attends religious service: Not on file    Active member of club or organization: Not on file    Attends meetings of clubs or organizations: Not on file    Relationship status: Not on file  . Intimate partner violence    Fear of current or ex partner: Not on file    Emotionally abused: Not on file    Physically abused: Not on file    Forced sexual activity: Not on file  Other Topics Concern  . Not on file  Social History Narrative  . Not on file    ROS Review of Systems  Constitutional: Negative for activity change, appetite change, chills and fever.  HENT: Negative for congestion, nosebleeds, trouble swallowing and voice change.   Respiratory: Negative for cough, shortness of breath and wheezing.   Gastrointestinal: Negative for diarrhea, nausea and vomiting.  Genitourinary: Negative for difficulty urinating, dysuria, flank pain and hematuria.  Musculoskeletal: Negative for back pain, joint swelling and neck pain.  Neurological: Negative for dizziness, speech difficulty, light-headedness and numbness.  See HPI. All other review of systems negative.   Objective   Vitals as reported by the patient: Today's Vitals   04/03/19 1303  BP: 123/82  Pulse: 89    Diagnoses and all orders for this visit:  COVID-19 virus infection -   Repeat test is pending  Essential hypertension -  bp improved on amlodipine 2.5mg  every other day  Other constipation -  Reviewed instruction on how to take metamucil so it is more palatable    I discussed the assessment and treatment plan with the patient. The patient was provided an opportunity to ask questions and all were answered. The patient agreed with the plan and demonstrated an understanding of the instructions.   The patient was advised to call back or seek an in-person evaluation if the symptoms worsen or if the condition fails to improve as anticipated.  I provided 25 minutes of non-face-to-face time during this encounter.  Forrest Moron, MD  Primary Care at [facility]

## 2019-04-10 NOTE — Telephone Encounter (Signed)
Pt called last week and today to ask for an update on her pprwrk. Pt was last updated that Nolon Rod had the pprwrk in her provider box. Thread does not have update for staff to inform pt. Please advise

## 2019-04-10 NOTE — Telephone Encounter (Signed)
Communicated with Joyce [name] that she had the pt's FMLA. I communicated back with the pt that her pprwrk is being handled and should be ready by Friday per the convo Joyce [name] and I had. Pt would like to come in late morning for pick up on friday

## 2019-04-12 ENCOUNTER — Telehealth: Payer: Self-pay

## 2019-04-12 NOTE — Telephone Encounter (Signed)
Left message on voicemail to return office call. (questions re: fmla, std paperwork and what/why and what  Incident are we completing them for). Dgaddy, CMA

## 2019-04-12 NOTE — Telephone Encounter (Signed)
Left message on voicemail std and fmla paperwork completed and ready for p/u at front desk.  Called pt earlier to find out why we were completing the forms and pt advised she had covid 19. Dgaddy, cma

## 2019-04-14 NOTE — Progress Notes (Signed)
Did not answer

## 2019-05-20 ENCOUNTER — Telehealth: Payer: Self-pay | Admitting: Family Medicine

## 2019-05-20 NOTE — Telephone Encounter (Signed)
Pt dropped additional disability for her work. Provider put down wronf dates , patient was out 03/11/2019-04/15/2019 pr work placed in cms box at nurses station

## 2019-05-22 NOTE — Telephone Encounter (Signed)
Pt  has form and will contact pt when form is completed.

## 2019-05-30 NOTE — Telephone Encounter (Signed)
PT CALLED IN REGARD TO DISABILITY PAPERWORK THAT WAS LEFT AT THE FRONT DESK ON 1/04. SHE SAID THE DEADLINE IS TOMORROW.   PLEASE ADVISE. PT WAITING ON CB

## 2019-05-31 NOTE — Telephone Encounter (Signed)
I have called pt back and spoke to her about her [employer] STD paperwork. She stated that it needed to be backdated to 03/11/2019 for he company to pay her for the month that she was out of work. She is not back at work. She needed the paperwork to say from 03/11/2019-04/15/2019 due to COVID-19. I have filled out what I could and placed the paperwork in the front box that says stalling (has pink post-it note on it).   Please advise on the paperwork.   Thanks, [employer]

## 2019-05-31 NOTE — Telephone Encounter (Signed)
Pt called back in regard to paperwork. Has not heard from staff or provider. Please advise

## 2019-06-03 NOTE — Telephone Encounter (Signed)
I have called pt today as I promised and informed her that we are working on the paperwork and it will be done by end of day today.   She should be able to pick it on Tuesday and it will be up front at pt pick up area.

## 2019-08-13 ENCOUNTER — Telehealth: Payer: Self-pay | Admitting: Family Medicine

## 2019-08-13 ENCOUNTER — Other Ambulatory Visit: Payer: Self-pay

## 2019-08-13 DIAGNOSIS — I10 Essential (primary) hypertension: Secondary | ICD-10-CM

## 2019-08-13 MED ORDER — AMLODIPINE BESYLATE 5 MG PO TABS: 2.5000 mg | ORAL_TABLET | Freq: Every day | ORAL | 1 refills | Status: DC

## 2019-08-13 NOTE — Telephone Encounter (Signed)
Sent lm for pt to call make appt.

## 2019-08-13 NOTE — Telephone Encounter (Signed)
Patient is calling needs  What is the name of the medication? amLODipine (NORVASC) 5 MG tablet     Have you contacted your pharmacy to request a refill? y  Which pharmacy would you like this sent to? [id number] [place], [place] - [address] AT [facility] [address]  [address], [place] [address]  Phone:  [phone] Fax:  [phone]    Patient notified that their request is being sent to the clinical staff for review and that they should receive a call once it is complete. If they do not receive a call within 72 hours they can check with their pharmacy or our office.

## 2019-08-15 ENCOUNTER — Telehealth: Payer: Self-pay | Admitting: Family Medicine

## 2019-08-15 ENCOUNTER — Other Ambulatory Visit: Payer: Self-pay

## 2019-08-15 DIAGNOSIS — I10 Essential (primary) hypertension: Secondary | ICD-10-CM

## 2019-08-15 MED ORDER — AMLODIPINE BESYLATE 5 MG PO TABS: 2.5000 mg | ORAL_TABLET | Freq: Every day | ORAL | 1 refills | Status: DC

## 2019-08-15 NOTE — Telephone Encounter (Signed)
Pt unable to get thru to [facility] on [address] to have them transfer med to [facility]. [employer]. Will resend to [facility] [facility] and cancel rx at [facility] [facility] and [address]. Pt requesting change and is appreciative.

## 2019-08-15 NOTE — Telephone Encounter (Signed)
Pt called and made an appt on 4/16 Pt is wondering if she can get a courtesy refill until that appt. Pt states she has been out since last Sunday 3/28. Please advise. [phone]

## 2019-08-15 NOTE — Telephone Encounter (Signed)
Amlodipine 5 mg #30 with 1 refill sent to pharmacy file  and pt notified an agreeable.

## 2019-08-15 NOTE — Telephone Encounter (Signed)
Pt is wanting proscription sent in to this pharmacy so she can go on her lunch break. Please advise. [phone]  [facility] [address], [place], [place] [address]

## 2019-08-30 ENCOUNTER — Other Ambulatory Visit: Payer: Self-pay

## 2019-08-30 ENCOUNTER — Encounter: Payer: Self-pay | Admitting: Family Medicine

## 2019-08-30 ENCOUNTER — Ambulatory Visit: Payer: Managed Care, Other (non HMO) | Admitting: Family Medicine

## 2019-08-30 VITALS — BP 143/84 | HR 95 | Temp 98.2°F | Ht 67.0 in | Wt 171.2 lb

## 2019-08-30 DIAGNOSIS — R358 Other polyuria: Secondary | ICD-10-CM

## 2019-08-30 DIAGNOSIS — I10 Essential (primary) hypertension: Secondary | ICD-10-CM

## 2019-08-30 DIAGNOSIS — R3589 Other polyuria: Secondary | ICD-10-CM

## 2019-08-30 LAB — POCT URINALYSIS DIP (MANUAL ENTRY)
Bilirubin, UA: NEGATIVE
Blood, UA: NEGATIVE
Glucose, UA: NEGATIVE mg/dL
Ketones, POC UA: NEGATIVE mg/dL
Leukocytes, UA: NEGATIVE
Nitrite, UA: NEGATIVE
Protein Ur, POC: NEGATIVE mg/dL
Spec Grav, UA: 1.025 (ref 1.010–1.025)
Urobilinogen, UA: 0.2 E.U./dL
pH, UA: 7 (ref 5.0–8.0)

## 2019-08-30 MED ORDER — AMLODIPINE BESYLATE 5 MG PO TABS: 5.0000 mg | ORAL_TABLET | ORAL | 1 refills | Status: DC

## 2019-08-30 NOTE — Progress Notes (Signed)
Established Patient Office Visit  Subjective:  Patient ID: Joyce [name], female    DOB: 1970/10/13  Age: 49 y.o. MRN: [id number]  CC:  Chief Complaint  Patient presents with  . Hypertension    med refill     HPI [name] presents for   Hypertension: Patient here for follow-up of elevated blood pressure. She is exercising and is adherent to low salt diet.  Blood pressure is well controlled at home. Cardiac symptoms none. Patient denies chest pain, claudication, dyspnea and exertional chest pressure/discomfort.  Cardiovascular risk factors: hypertension. Use of agents associated with hypertension: none. History of target organ damage: none. She is taking amlodipine 5mg  every other day.    BP Readings from Last 3 Encounters:  08/30/19 (!) 143/84  04/03/19 123/82  04/02/19 121/84     Urinary Frequency She reports that she drinks a cup of coffee a day She states that she noticed that she is urinating more frequently.  She denies dysuria or hematuria She notices that she goes every hour   Past Medical History:  Diagnosis Date  . Anxiety state 03/15/2014  . Dehydration 03/15/2014  . DYSURIA, CHRONIC   . Elevated BP 03/15/2014  . GENITAL HERPES   . GERD (gastroesophageal reflux disease)   . Headache(784.0)   . IBS   . Palpitations 03/15/2014    Past Surgical History:  Procedure Laterality Date  . BREAST LUMPECTOMY Left   . TUBAL LIGATION  2003    Family History  Problem Relation Age of Onset  . Hypertension Mother   . Irritable bowel syndrome Father     Social History   Socioeconomic History  . Marital status: Married    Spouse name: Not on file  . Number of children: 3  . Years of education: Not on file  . Highest education level: Not on file  Occupational History  . Occupation: 2004: [employer]  Tobacco Use  . Smoking status: Former Smoker    Types: Cigarettes  . Smokeless tobacco: Never Used  . Tobacco comment: Only  smokes when she is stress. Married, lives with spouse  Substance and Sexual Activity  . Alcohol use: Yes    Comment: 1 glass at bedtime  . Drug use: No  . Sexual activity: Not on file  Other Topics Concern  . Not on file  Social History Narrative  . Not on file   Social Determinants of Health   Financial Resource Strain:   . Difficulty of Paying Living Expenses:   Food Insecurity:   . Worried About Nutritional therapist in the Last Year:   . Programme researcher, broadcasting/film/video in the Last Year:   Transportation Needs:   . Barista (Medical):   Freight forwarder Lack of Transportation (Non-Medical):   Physical Activity:   . Days of Exercise per Week:   . Minutes of Exercise per Session:   Stress:   . Feeling of Stress :   Social Connections:   . Frequency of Communication with Friends and Family:   . Frequency of Social Gatherings with Friends and Family:   . Attends Religious Services:   . Active Member of Clubs or Organizations:   . Attends Marland Kitchen Meetings:   Banker Marital Status:   Intimate Partner Violence:   . Fear of Current or Ex-Partner:   . Emotionally Abused:   Marland Kitchen Physically Abused:   . Sexually Abused:     Outpatient Medications Prior  to Visit  Medication Sig Dispense Refill  . acetaminophen (TYLENOL) 500 MG tablet Take 1,000 mg by mouth every 6 (six) hours as needed for moderate pain, fever or headache.    . albuterol (VENTOLIN HFA) 108 (90 Base) MCG/ACT inhaler Inhale 2 puffs into the lungs every 6 (six) hours as needed for wheezing or shortness of breath. 18 g 1  . Blood Pressure Monitor DEVI Use to check blood pressures daily for hypertension and monitoring vitals at home due to COVID 1 Device 0  . cetirizine (ZYRTEC) 10 MG tablet Take 10 mg by mouth daily.    . clotrimazole-betamethasone (LOTRISONE) cream Apply 1 application topically 2 (two) times daily. 30 g 0  . ELDERBERRY PO Take 1 Dose by mouth daily.    . Multiple Vitamin (MULTIVITAMIN) tablet Take 1 tablet by  mouth daily.      . norgestimate-ethinyl estradiol (ESTARYLLA) 0.25-35 MG-MCG tablet Take 1 tablet by mouth daily.    Marland Kitchen Phenylephrine-DM-GG-APAP (MUCINEX FAST-MAX COLD FLU PO) Take 10 mLs by mouth as needed (cold and flu). Liquid    . amLODipine (NORVASC) 5 MG tablet Take 0.5 tablets (2.5 mg total) by mouth daily. 30 tablet 1   No facility-administered medications prior to visit.    Allergies  Allergen Reactions  . Peach [Prunus Persica] Itching and Rash  . Penicillins Itching and Rash    ROS Review of Systems    Objective:    Physical Exam  BP (!) 143/84   Pulse 95   Temp 98.2 F (36.8 C) (Temporal)   Ht 5\' 7"  (1.702 m)   Wt 171 lb 3.2 oz (77.7 kg)   LMP 07/26/2019   SpO2 99%   BMI 26.81 kg/m  Wt Readings from Last 3 Encounters:  08/30/19 171 lb 3.2 oz (77.7 kg)  12/26/18 174 lb 12.8 oz (79.3 kg)  11/26/18 169 lb 3.2 oz (76.7 kg)   Physical Exam  Constitutional: Oriented to person, place, and time. Appears well-developed and well-nourished.  HENT:  Head: Normocephalic and atraumatic.  Eyes: Conjunctivae and EOM are normal.  Cardiovascular: Normal rate, regular rhythm, normal heart sounds and intact distal pulses.  No murmur heard. Pulmonary/Chest: Effort normal and breath sounds normal. No stridor. No respiratory distress. Has no wheezes.  Abdomen: nondistended, normoactive bs, soft, nontender, no cva tenderness Neurological: Is alert and oriented to person, place, and time.  Skin: Skin is warm. Capillary refill takes less than 2 seconds.  Psychiatric: Has a normal mood and affect. Behavior is normal. Judgment and thought content normal.     There are no preventive care reminders to display for this patient.  There are no preventive care reminders to display for this patient.  Lab Results  Component Value Date   TSH 1.440 11/26/2018   Lab Results  Component Value Date   WBC 2.7 (L) 03/20/2019   HGB 13.5 03/20/2019   HCT 41.4 03/20/2019   MCV 88.8  03/20/2019   PLT 224 03/20/2019   Lab Results  Component Value Date   NA 138 03/20/2019   K 4.1 03/20/2019   CO2 24 03/20/2019   GLUCOSE 93 03/20/2019   BUN 6 03/20/2019   CREATININE 0.83 03/20/2019   BILITOT 0.4 03/20/2019   ALKPHOS 88 03/20/2019   AST 30 03/20/2019   ALT 20 03/20/2019   PROT 7.2 03/20/2019   ALBUMIN 3.5 03/20/2019   CALCIUM 8.5 (L) 03/20/2019   ANIONGAP 11 03/20/2019   GFR 128.50 08/14/2014   Lab Results  Component  Value Date   CHOL 202 (H) 11/26/2018   Lab Results  Component Value Date   HDL 71 11/26/2018   Lab Results  Component Value Date   LDLCALC 114 (H) 11/26/2018   Lab Results  Component Value Date   TRIG 84 11/26/2018   Lab Results  Component Value Date   CHOLHDL 2.8 11/26/2018   Lab Results  Component Value Date   HGBA1C 5.5 11/26/2018      Assessment & Plan:   Problem List Items Addressed This Visit      Other   POLYURIA - concerning for OAB, limit caffeine, kegel exercises Will monitor   Relevant Orders   POCT urinalysis dipstick (Completed)    Other Visit Diagnoses    Essential hypertension    -  Primary Patient's blood pressure is at goal of 139/89 or less. Condition is stable. Continue current medications and treatment plan. I recommend that you exercise for 30-45 minutes 5 days a week. I also recommend a balanced diet with fruits and vegetables every day, lean meats, and little fried foods. The DASH diet (you can find this online) is a good example of this. Continue current dose Refill sent   Relevant Medications   amLODipine (NORVASC) 5 MG tablet   Other Relevant Orders   Basic metabolic panel      Meds ordered this encounter  Medications  . amLODipine (NORVASC) 5 MG tablet    Sig: Take 1 tablet (5 mg total) by mouth every other day.    Dispense:  45 tablet    Refill:  1    Patient needs appointment next month.    Follow-up: Return in about 3 months (around 11/29/2019) for hypertension .    Forrest Moron, MD

## 2019-08-30 NOTE — Patient Instructions (Addendum)
  Continue amlodipine every other day Cut down on caffeine and alcohol which has caffeine Drink more water  Do kegel exercises    If you have lab work done today you will be contacted with your lab results within the next 2 weeks.  If you have not heard from [place] then please contact us. The fastest way to get your results is to register for My Chart.   IF you received an x-ray today, you will receive an invoice from [facility]. Please contact [facility] at [phone] with questions or concerns regarding your invoice.   IF you received labwork today, you will receive an invoice from [facility]. Please contact [facility] at [phone] with questions or concerns regarding your invoice.   Our billing staff will not be able to assist you with questions regarding bills from these companies.  You will be contacted with the lab results as soon as they are available. The fastest way to get your results is to activate your My Chart account. Instructions are located on the last page of this paperwork. If you have not heard from [place] regarding the results in 2 weeks, please contact this office.      Kegel Exercises  Kegel exercises can help strengthen your pelvic floor muscles. The pelvic floor is a group of muscles that support your rectum, small intestine, and bladder. In females, pelvic floor muscles also help support the womb (uterus). These muscles help you control the flow of urine and stool. Kegel exercises are painless and simple, and they do not require any equipment. Your provider may suggest Kegel exercises to:  Improve bladder and bowel control.  Improve sexual response.  Improve weak pelvic floor muscles after surgery to remove the uterus (hysterectomy) or pregnancy (females).  Improve weak pelvic floor muscles after prostate gland removal or surgery (males). Kegel exercises involve squeezing your pelvic floor muscles, which are the same muscles you squeeze when you  try to stop the flow of urine or keep from passing gas. The exercises can be done while sitting, standing, or lying down, but it is best to vary your position. Exercises How to do Kegel exercises: 1. Squeeze your pelvic floor muscles tight. You should feel a tight lift in your rectal area. If you are a female, you should also feel a tightness in your vaginal area. Keep your stomach, buttocks, and legs relaxed. 2. Hold the muscles tight for up to 10 seconds. 3. Breathe normally. 4. Relax your muscles. 5. Repeat as told by your health care provider. Repeat this exercise daily as told by your health care provider. Continue to do this exercise for at least 4-6 weeks, or for as long as told by your health care provider. You may be referred to a physical therapist who can help you learn more about how to do Kegel exercises. Depending on your condition, your health care provider may recommend:  Varying how long you squeeze your muscles.  Doing several sets of exercises every day.  Doing exercises for several weeks.  Making Kegel exercises a part of your regular exercise routine. This information is not intended to replace advice given to you by your health care provider. Make sure you discuss any questions you have with your health care provider. Document Revised: 12/20/2017 Document Reviewed: 12/20/2017 Elsevier Patient Education  2020 [employer].

## 2019-08-31 LAB — BASIC METABOLIC PANEL
BUN/Creatinine Ratio: 12 (ref 9–23)
BUN: 9 mg/dL (ref 6–24)
CO2: 22 mmol/L (ref 20–29)
Calcium: 9.1 mg/dL (ref 8.7–10.2)
Chloride: 106 mmol/L (ref 96–106)
Creatinine, Ser: 0.73 mg/dL (ref 0.57–1.00)
GFR calc Af Amer: 113 mL/min/{1.73_m2} (ref >59)
GFR calc non Af Amer: 98 mL/min/{1.73_m2} (ref >59)
Glucose: 83 mg/dL (ref 65–99)
Potassium: 4.3 mmol/L (ref 3.5–5.2)
Sodium: 144 mmol/L (ref 134–144)

## 2020-04-07 ENCOUNTER — Other Ambulatory Visit: Payer: Self-pay

## 2020-04-07 ENCOUNTER — Other Ambulatory Visit: Payer: Managed Care, Other (non HMO)

## 2020-04-07 DIAGNOSIS — Z20822 Contact with and (suspected) exposure to covid-19: Secondary | ICD-10-CM

## 2020-04-09 LAB — NOVEL CORONAVIRUS, NAA: SARS-CoV-2, NAA: NOT DETECTED

## 2020-04-09 LAB — SARS-COV-2, NAA 2 DAY TAT

## 2021-03-23 ENCOUNTER — Ambulatory Visit: Payer: 59 | Admitting: Cardiology

## 2021-03-23 ENCOUNTER — Encounter: Payer: Self-pay | Admitting: Cardiology

## 2021-03-23 ENCOUNTER — Other Ambulatory Visit: Payer: Self-pay

## 2021-03-23 VITALS — BP 120/77 | HR 82 | Resp 16 | Ht 67.0 in | Wt 173.6 lb

## 2021-03-23 DIAGNOSIS — R42 Dizziness and giddiness: Secondary | ICD-10-CM

## 2021-03-23 DIAGNOSIS — R002 Palpitations: Secondary | ICD-10-CM

## 2021-03-23 DIAGNOSIS — I10 Essential (primary) hypertension: Secondary | ICD-10-CM

## 2021-03-23 NOTE — Progress Notes (Signed)
Primary Physician/Referring:  Forrest Moron, MD  Patient ID: Joyce [name], female    DOB: 1970-08-04, 50 y.o.   MRN: [id number]  Chief Complaint  Patient presents with   Palpitations   New Patient (Initial Visit)   HPI:    ONEY [name]  is a 50 y.o. AA female with history of hypertension (diagnosed in 2020), anxiety. She is referred to our office by PCP, Shanon Rosser, PA, for evaluation of palpitations.  Patient denies family history of premature CAD or cardiomyopathy.   Patient reports occasional episodes of palpitations with associated chest pressure intermittent over the last 3 months.  Patient states since September she has had 4-5 episodes of palpitations with associated high pressure lasting <5 minutes.  Patient reports stressful situations seem to trigger episodes of palpitations, however she has also had an episode while calm and at rest.  Denies chest pain, syncope, near syncope, dyspnea.  Patient denies illicit drug use, high caffeine intake, or use of stimulants.  She does occasionally take Mucinex over-the-counter for sinus congestion.  Patient is relatively inactive, working a sedentary job and without a regular exercise routine.  She is presently taking amlodipine for hypertension, and blood pressure is well controlled.  Patient is accompanied by her husband at today's office visit, she gives permission to discuss medical history with him present.  Patient's husband contributes some to history of present symptoms.   Past Medical History:  Diagnosis Date   Anxiety state 03/15/2014   Dehydration 03/15/2014   DYSURIA, CHRONIC    Elevated BP 03/15/2014   GENITAL HERPES    GERD (gastroesophageal reflux disease)    Headache(784.0)    IBS    Palpitations 03/15/2014   Past Surgical History:  Procedure Laterality Date   BREAST LUMPECTOMY Left    TUBAL LIGATION  2003   Family History  Problem Relation Age of Onset   Hypertension Mother    Hypertension Father     Irritable bowel syndrome Father    Hypertension Sister     Social History   Tobacco Use   Smoking status: Former    Packs/day: 0.25    Years: 0.50    Pack years: 0.13    Types: Cigarettes   Smokeless tobacco: Never   Tobacco comments:    Only smokes when she is stress. Married, lives with spouse  Substance Use Topics   Alcohol use: Yes    Comment: 1 glass at bedtime   Marital Status: Married   ROS  Review of Systems  Constitutional: Negative for malaise/fatigue and weight gain.  Cardiovascular:  Positive for palpitations (4-5 episodes in last 3 months, lasting <5 min). Negative for chest pain, claudication, leg swelling, near-syncope, orthopnea, paroxysmal nocturnal dyspnea and syncope.  Respiratory:  Negative for shortness of breath.   Neurological:  Positive for dizziness (with episodes of palpitaitons).   Objective  Blood pressure 120/77, pulse 82, resp. rate 16, height 5\' 7"  (1.702 m), weight 173 lb 9.6 oz (78.7 kg), SpO2 100 %.  Vitals with BMI 03/23/2021 08/30/2019 04/03/2019  Height 5\' 7"  5\' 7"  -  Weight 173 lbs 10 oz 171 lbs 3 oz -  BMI [id number] -  Systolic 123456 [id number]  Diastolic 77 84 82  Pulse 82 95 89      Physical Exam Vitals reviewed.  HENT:     Head: Normocephalic and atraumatic.  Cardiovascular:     Rate and Rhythm: Normal rate and regular rhythm.     Pulses: Intact  distal pulses.     Heart sounds: S1 normal and S2 normal. No murmur heard.   No gallop.  Pulmonary:     Effort: Pulmonary effort is normal. No respiratory distress.     Breath sounds: No wheezing, rhonchi or rales.  Musculoskeletal:     Right lower leg: No edema.     Left lower leg: No edema.  Neurological:     Mental Status: She is alert.    Laboratory examination:   No results for input(s): NA, K, CL, CO2, GLUCOSE, BUN, CREATININE, CALCIUM, GFRNONAA, GFRAA in the last 8760 hours. CrCl cannot be calculated (Patient's most recent lab result is older than the maximum 21 days  allowed.).  CMP Latest Ref Rng & Units 08/30/2019 03/20/2019 11/26/2018  Glucose 65 - 99 mg/dL 83 93 95  BUN 6 - 24 mg/dL 9 6 10   Creatinine 0.57 - 1.00 mg/dL 0.73 0.83 0.71  Sodium 134 - 144 mmol/L 144 138 141  Potassium 3.5 - 5.2 mmol/L 4.3 4.1 4.5  Chloride 96 - 106 mmol/L 106 103 105  CO2 20 - 29 mmol/L 22 24 23   Calcium 8.7 - 10.2 mg/dL 9.1 8.5(L) 8.8  Total Protein 6.5 - 8.1 g/dL - 7.2 7.1  Total Bilirubin 0.3 - 1.2 mg/dL - 0.4 <0.2  Alkaline Phos 38 - 126 U/L - 88 109  AST 15 - 41 U/L - 30 19  ALT 0 - 44 U/L - 20 15   CBC Latest Ref Rng & Units 03/20/2019 11/26/2018 05/04/2016  WBC 4.0 - 10.5 K/uL 2.7(L) 5.1 5.6  Hemoglobin 12.0 - 15.0 g/dL 13.5 13.0 11.9(L)  Hematocrit 36.0 - 46.0 % 41.4 40.4 36.2  Platelets 150 - 400 K/uL 224 316 265    Lipid Panel No results for input(s): CHOL, TRIG, LDLCALC, VLDL, HDL, CHOLHDL, LDLDIRECT in the last 8760 hours.  HEMOGLOBIN A1C Lab Results  Component Value Date   HGBA1C 5.5 11/26/2018   TSH No results for input(s): TSH in the last 8760 hours.  External labs:  02/18/2021: Iron studies within normal limits Total cholesterol 199, HDL 69, LDL 113, triglycerides 75 BUN 10, creatinine 0.61, GFR >60, sodium 139, potassium 4.7, AST 12, ALT 8 Hgb 12.4, HCT 38.4, MCV 90.6, platelet 289 TSH 1.93 A1c 5.5%  08/30/2019: BUN 9, creatinine 0.73, GFR >60  11/26/2018: HDL 31, LDL 114, total cholesterol 202, triglycerides 84 A1c 5.5% TSH 1.44  Allergies   Allergies  Allergen Reactions   Peach [Prunus Persica] Itching and Rash   Penicillins Itching and Rash    Medications Prior to Visit:   Outpatient Medications Prior to Visit  Medication Sig Dispense Refill   acetaminophen (TYLENOL) 500 MG tablet Take 1,000 mg by mouth every 6 (six) hours as needed for moderate pain, fever or headache.     albuterol (VENTOLIN HFA) 108 (90 Base) MCG/ACT inhaler Inhale 2 puffs into the lungs every 6 (six) hours as needed for wheezing or shortness of  breath. 18 g 1   amLODipine (NORVASC) 5 MG tablet Take 1 tablet (5 mg total) by mouth every other day. 45 tablet 1   Blood Pressure Monitor DEVI Use to check blood pressures daily for hypertension and monitoring vitals at home due to COVID 1 Device 0   cetirizine (ZYRTEC) 10 MG tablet Take 10 mg by mouth daily.     ELDERBERRY PO Take 1 Dose by mouth daily.     linaclotide (LINZESS) 290 MCG CAPS capsule Linzess 290 mcg capsule  Multiple Vitamin (MULTIVITAMIN) tablet Take 1 tablet by mouth daily.       norgestimate-ethinyl estradiol (ORTHO-CYCLEN) 0.25-35 MG-MCG tablet Take 1 tablet by mouth daily.     Phenylephrine-DM-GG-APAP (MUCINEX FAST-MAX COLD FLU PO) Take 10 mLs by mouth as needed (cold and flu). Liquid     clotrimazole-betamethasone (LOTRISONE) cream Apply 1 application topically 2 (two) times daily. 30 g 0   No facility-administered medications prior to visit.   Final Medications at End of Visit    Current Meds  Medication Sig   acetaminophen (TYLENOL) 500 MG tablet Take 1,000 mg by mouth every 6 (six) hours as needed for moderate pain, fever or headache.   albuterol (VENTOLIN HFA) 108 (90 Base) MCG/ACT inhaler Inhale 2 puffs into the lungs every 6 (six) hours as needed for wheezing or shortness of breath.   amLODipine (NORVASC) 5 MG tablet Take 1 tablet (5 mg total) by mouth every other day.   Blood Pressure Monitor DEVI Use to check blood pressures daily for hypertension and monitoring vitals at home due to COVID   cetirizine (ZYRTEC) 10 MG tablet Take 10 mg by mouth daily.   ELDERBERRY PO Take 1 Dose by mouth daily.   linaclotide (LINZESS) 290 MCG CAPS capsule Linzess 290 mcg capsule   Multiple Vitamin (MULTIVITAMIN) tablet Take 1 tablet by mouth daily.     norgestimate-ethinyl estradiol (ORTHO-CYCLEN) 0.25-35 MG-MCG tablet Take 1 tablet by mouth daily.   Phenylephrine-DM-GG-APAP (MUCINEX FAST-MAX COLD FLU PO) Take 10 mLs by mouth as needed (cold and flu). Liquid   Radiology:    No results found.  Cardiac Studies:   None   EKG:   03/23/2021: Sinus rhythm at a rate of 81 bpm.  Normal axis.  No evidence of ischemia or underlying injury pattern.  External EKG 02/18/2021: Sinus rhythm at a rate of 70 bpm.  Normal axis.  No evidence of ischemia or underlying injury pattern.  Assessment     ICD-10-CM   1. Palpitations  R00.2 EKG 12-Lead    PCV ECHOCARDIOGRAM COMPLETE    2. Dizziness  R42 PCV ECHOCARDIOGRAM COMPLETE    3. Essential hypertension  I10        Medications Discontinued During This Encounter  Medication Reason   clotrimazole-betamethasone (LOTRISONE) cream Error    No orders of the defined types were placed in this encounter.   Recommendations:   VAYA GHERARDI is a 50 y.o. AA female with history of hypertension (diagnosed in 2020), anxiety. She is referred to our office by PCP, Shanon Rosser, PA, for evaluation of palpitations.  Patient denies family history of premature CAD or cardiomyopathy.   Palpitations:  EKG today reveals normal sinus rhythm.  Patient's physical exam is without significant abnormality  I personally reviewed external labs, patient's TSH and hemoglobin within normal limits No identifiable reversible causes Have a low suspicion for significant underlying cardiac arrhythmia.  Discussed with patient option to proceed with cardiac monitor, however as episodes are infrequent doing 2-week cardiac monitor would likely be low yield.  Shared decision was to hold off on cardiac monitor at this time.  However counseled patient that if symptoms increase in frequency, intensity, or longevity or if she has other associated symptoms would recommend further evaluation, patient will notify our office if symptoms worsen.  Counseled patient to avoid the use of stimulants, including frequent use of over-the-counter Mucinex.  Advised patient of reversible causes/triggers to avoid, she verbalized understanding and agreement. Will obtain  echocardiogram to rule out underlying  structural disease. As long as echocardiogram is without significant abnormality will plan to follow up as needed.   Hypertension: Currently managed by PCP Well-controlled on amlodipine 5 mg p.o. daily  Follow up as needed.   Patient was seen in collaboration with Dr. Odis Hollingshead. He also reviewed patient's chart and examined the patient. Dr. Odis Hollingshead is in agreement of the plan.     Rayford Halsted, PA-C 03/23/2021, 4:28 PM Office: [phone]  CC: Lindaann Pascal, PA-C

## 2021-04-23 ENCOUNTER — Ambulatory Visit: Payer: 59

## 2021-04-23 ENCOUNTER — Other Ambulatory Visit: Payer: Self-pay

## 2021-04-23 DIAGNOSIS — R42 Dizziness and giddiness: Secondary | ICD-10-CM

## 2021-04-23 DIAGNOSIS — R002 Palpitations: Secondary | ICD-10-CM

## 2021-04-26 NOTE — Progress Notes (Signed)
Called pt, no answer. Left VM requesting call back.

## 2021-04-26 NOTE — Progress Notes (Signed)
Pt returned call, made aware of results and rto prn//ah

## 2022-06-16 ENCOUNTER — Ambulatory Visit (HOSPITAL_BASED_OUTPATIENT_CLINIC_OR_DEPARTMENT_OTHER)
Admission: RE | Admit: 2022-06-16 | Discharge: 2022-06-16 | Disposition: A | Discharge status: Home / Self Care | Payer: BC Managed Care – PPO | Source: Ambulatory Visit | Attending: Obstetrics and Gynecology | Admitting: Obstetrics and Gynecology

## 2022-06-16 ENCOUNTER — Other Ambulatory Visit (HOSPITAL_BASED_OUTPATIENT_CLINIC_OR_DEPARTMENT_OTHER): Payer: Self-pay | Admitting: Obstetrics and Gynecology

## 2022-06-16 DIAGNOSIS — T8332XA Displacement of intrauterine contraceptive device, initial encounter: Secondary | ICD-10-CM

## 2022-07-25 ENCOUNTER — Other Ambulatory Visit: Payer: Self-pay | Admitting: Obstetrics and Gynecology

## 2022-07-25 DIAGNOSIS — Z01818 Encounter for other preprocedural examination: Secondary | ICD-10-CM

## 2022-08-01 ENCOUNTER — Encounter (HOSPITAL_BASED_OUTPATIENT_CLINIC_OR_DEPARTMENT_OTHER): Payer: Self-pay | Admitting: Obstetrics and Gynecology

## 2022-08-02 ENCOUNTER — Other Ambulatory Visit: Payer: Self-pay

## 2022-08-02 ENCOUNTER — Encounter (HOSPITAL_BASED_OUTPATIENT_CLINIC_OR_DEPARTMENT_OTHER): Payer: Self-pay | Admitting: Obstetrics and Gynecology

## 2022-08-02 NOTE — Progress Notes (Addendum)
Spoke w/ via phone for pre-op interview---Joyce [name] needs dos---- serum pregnancy              [name] results------08/03/22 [name] appt for cbc, type & screen, bmp, EKG, 04/23/21 echocardiogram COVID test -----patient states asymptomatic no test needed Arrive at -------0830 on Wednesday, 08/10/22 NPO after MN NO Solid Food.  Clear liquids from MN until---0730 Med rec completed Medications to take morning of surgery -----Zyrtec, Ubrevly prn, Albuterol prn, Amlodipine, Metoprolol Diabetic medication -----n/a Patient instructed no nail polish to be worn day of surgery Patient instructed to bring photo id and insurance card day of surgery Patient aware to have Driver (ride ) / caregiver    for 24 hours after surgery - husband, Joyce [name] Patient Special Instructions -----Extended / overnight stay instructions given. Bring albuterol inhaler on day of surgery. Pre-Op special Istructions -----Patient aware that upper dentures must be removed prior to OR. Patient verbalized understanding of instructions that were given at this phone interview. Patient denies shortness of breath, chest pain, fever, cough at this phone interview.

## 2022-08-02 NOTE — Progress Notes (Signed)
Your procedure is scheduled on Wednesday, 08/10/2022.  Report to [facility] AT  8:30  AM.   Call this number if you have problems the morning of surgery  :[phone].   OUR ADDRESS IS [address].  WE ARE LOCATED IN [facility].  PLEASE BRING YOUR INSURANCE CARD AND PHOTO ID DAY OF SURGERY.  ONLY 2 PEOPLE ARE ALLOWED IN  WAITING  ROOM / CURRENTLY NO ONE UNDER AGE 50                                     REMEMBER:  DO NOT EAT FOOD, CANDY GUM OR MINTS  AFTER MIDNIGHT THE NIGHT BEFORE YOUR SURGERY . YOU MAY HAVE CLEAR LIQUIDS FROM MIDNIGHT THE NIGHT BEFORE YOUR SURGERY UNTIL  7:30 AM. NO CLEAR LIQUIDS AFTER   7:30 AM DAY OF SURGERY.  YOU MAY  BRUSH YOUR TEETH MORNING OF SURGERY AND RINSE YOUR MOUTH OUT, NO CHEWING GUM CANDY OR MINTS.     CLEAR LIQUID DIET    Allowed      Water                                                                   Coffee and tea, regular and decaf  (NO cream or milk products of any type, may sweeten)                         Carbonated beverages, regular and diet                                    Sports drinks like Gatorade _____________________________________________________________________     TAKE ONLY THESE MEDICATIONS MORNING OF SURGERY: Zyrtec, Amlodipine, Metoprolol, Ubrelvy if needed, Albuterol inhaler if needed ( Please bring inhaler with you on day of surgery.)    UP TO 4 VISITORS  MAY VISIT IN THE EXTENDED RECOVERY ROOM UNTIL 800 PM ONLY.  ONE  VISITOR AGE 33 AND OVER MAY SPEND THE NIGHT AND MUST BE IN EXTENDED RECOVERY ROOM NO LATER THAN 800 PM . YOUR DISCHARGE TIME AFTER YOU SPEND THE NIGHT IS 900 AM THE MORNING AFTER YOUR SURGERY.  YOU MAY PACK A SMALL OVERNIGHT BAG WITH TOILETRIES FOR YOUR OVERNIGHT STAY IF YOU WISH.  YOUR PRESCRIPTION MEDICATIONS WILL BE PROVIDED DURING [place].                                      DO NOT WEAR JEWERLY/  METAL/  PIERCINGS (INCLUDING NO PLASTIC  PIERCINGS) DO NOT WEAR LOTIONS, POWDERS, PERFUMES OR NAIL POLISH ON YOUR FINGERNAILS. TOENAIL POLISH IS OK TO WEAR. DO NOT SHAVE FOR 48 HOURS PRIOR TO DAY OF SURGERY.  CONTACTS, GLASSES, OR DENTURES MAY NOT BE WORN TO SURGERY.  REMEMBER: NO SMOKING, VAPING ,  DRUGS OR ALCOHOL FOR 24 HOURS BEFORE YOUR SURGERY.  [facility] IS NOT RESPONSIBLE  FOR ANY BELONGINGS.                                                                    Marland Kitchen           [facility] - Preparing for Surgery Before surgery, you can play an important role.  Because skin is not sterile, your skin needs to be as free of germs as possible.  You can reduce the number of germs on your skin by washing with CHG (chlorahexidine gluconate) soap before surgery.  CHG is an antiseptic cleaner which kills germs and bonds with the skin to continue killing germs even after washing. Please DO NOT use if you have an allergy to CHG or antibacterial soaps.  If your skin becomes reddened/irritated stop using the CHG and inform your nurse when you arrive at Short Stay. Do not shave (including legs and underarms) for at least 48 hours prior to the first CHG shower.  You may shave your face/neck. Please follow these instructions carefully:  1.  Shower with CHG Soap the night before surgery and the  morning of Surgery.  2.  If you choose to wash your hair, wash your hair first as usual with your  normal  shampoo.  3.  After you shampoo, rinse your hair and body thoroughly to remove the  shampoo.                                        4.  Use CHG as you would any other liquid soap.  You can apply chg directly  to the skin and wash , chg soap provided, night before and morning of your surgery.  5.  Apply the CHG Soap to your body ONLY FROM THE NECK DOWN.   Do not use on face/ open                           Wound or open sores. Avoid contact with eyes, ears mouth and genitals (private parts).                       Wash  face,  Genitals (private parts) with your normal soap.             6.  Wash thoroughly, paying special attention to the area where your surgery  will be performed.  7.  Thoroughly rinse your body with warm water from the neck down.  8.  DO NOT shower/wash with your normal soap after using and rinsing off  the CHG Soap.             9.  Pat yourself dry with a clean towel.            10.  Wear clean pajamas.            11.  Place clean sheets on your bed the night of your first shower and do not  sleep with pets. Day of Surgery : Do not apply any lotions/ powders the morning of surgery.  Please wear clean clothes to the hospital/surgery  center.  IF YOU HAVE ANY SKIN IRRITATION OR PROBLEMS WITH THE SURGICAL SOAP, PLEASE GET A BAR OF GOLD DIAL SOAP AND SHOWER THE NIGHT BEFORE YOUR SURGERY AND THE MORNING OF YOUR SURGERY. PLEASE LET THE NURSE KNOW MORNING OF YOUR SURGERY IF YOU HAD ANY PROBLEMS WITH THE SURGICAL SOAP.   ________________________________________________________________________                                                        QUESTIONS [name] PRE OP NURSE PHONE [phone].

## 2022-08-03 ENCOUNTER — Encounter (HOSPITAL_COMMUNITY)
Admission: RE | Admit: 2022-08-03 | Discharge: 2022-08-03 | Disposition: A | Discharge status: Home / Self Care | Payer: BC Managed Care – PPO | Source: Ambulatory Visit | Attending: Obstetrics and Gynecology | Admitting: Obstetrics and Gynecology

## 2022-08-03 DIAGNOSIS — Z01818 Encounter for other preprocedural examination: Secondary | ICD-10-CM | POA: Insufficient documentation

## 2022-08-03 LAB — CBC
HCT: 35.5 % — ABNORMAL LOW (ref 36.0–46.0)
Hemoglobin: 10.9 g/dL — ABNORMAL LOW (ref 12.0–15.0)
MCH: 27.9 pg (ref 26.0–34.0)
MCHC: 30.7 g/dL (ref 30.0–36.0)
MCV: 91 fL (ref 80.0–100.0)
Platelets: 355 10*3/uL (ref 150–400)
RBC: 3.9 MIL/uL (ref 3.87–5.11)
RDW: 14.6 % (ref 11.5–15.5)
WBC: 5 10*3/uL (ref 4.0–10.5)
nRBC: 0 % (ref 0.0–0.2)

## 2022-08-03 LAB — BASIC METABOLIC PANEL
Anion gap: 5 (ref 5–15)
BUN: 10 mg/dL (ref 6–20)
CO2: 24 mmol/L (ref 22–32)
Calcium: 8.5 mg/dL — ABNORMAL LOW (ref 8.9–10.3)
Chloride: 111 mmol/L (ref 98–111)
Creatinine, Ser: 0.83 mg/dL (ref 0.44–1.00)
GFR, Estimated: >60 mL/min (ref >60)
Glucose, Bld: 124 mg/dL — ABNORMAL HIGH (ref 70–99)
Potassium: 3.6 mmol/L (ref 3.5–5.1)
Sodium: 140 mmol/L (ref 135–145)

## 2022-08-04 NOTE — H&P (Signed)
52 y.o. G5P3 complains of severe menorrhagia not helped with IUD or POPs.  Pt is mildly anemic as well.  KR pt; Pt. here for consult for hysterectomy; "TC on 05/31/2022 c/o heavy bleeding and passage of clots. At that time pt believed to have Mirena. Rx for Provera 10mg  daily for 10 days given. Appt scheduled for OV & [place]. No IUD on [place] visualized on [place] on 06/16/2022. Prior to 05/31/2022 pt had been using estrogen patch for BTB, transitioned to po estradiol 0/5 mg b/c of c ost of patch 07/2021  XR of abdomen and pelvis negative for IUD c/w expulsion of IUD  Endo thickness 17 mm on [place] on 2/1.  Given provera 20mg  daily for 10 days" was given Aygestin last visit; EMB-neg; still having heavy bleeding/tb//  Pt having excessive break through bleeding, is tired and desires definitive.   [place] recently : Ind: Menorrhagia, eval for IUD  TAUS: AV uterus, incompletely distended. Proceeding to vaginal imaging was required for detailed evaluation of pelvic anatomy.  TVUS: AV uterus with endometrial thickness measuring 2.3 cm with somewhat trilaminar appearance. Multiple uterine fibroids noted. 3 measured  1) Anterior 1.27 x 1.13 x 1.65cm  2) Posterior 1.88 x 1.4 x 1.61 cm  3) Anterior intramural 1.37 x 0.72 x 1.2 cm  No IUD visualized  No FF in CDS  Adnexa not visualized  Imp: Thickened appearing endometrium, no IUD visualized"  Total size 10x7 x6; bleeding is off and on but very heavy.    Past Medical History:  Diagnosis Date   Anxiety state 03/15/2014   resolved per pt as of 08/02/2022   Constipation    takes Linzess prn   COVID-19 03/2019   pneumonia, no hospitalization   Fibroid, uterine    GENITAL HERPES    Headache(784.0)    migraines, takes Ubrelvy prn   HTN (hypertension)    Follows w/ Shanon Rosser, PA @ [facility].   IBS    Palpitations 03/15/2014   Follows w/ Shanon Rosser, PA. Taking metoprolol as of 08/02/22.   Wears contact lenses    Wears dentures    upper plate   Wears  glasses    Past Surgical History:  Procedure Laterality Date   BREAST LUMPECTOMY Left    COLONOSCOPY WITH ESOPHAGOGASTRODUODENOSCOPY (EGD)  2011   TUBAL LIGATION  05/16/2001    Social History   Socioeconomic History   Marital status: Married    Spouse name: Not on file   Number of children: 3   Years of education: Not on file   Highest education level: Not on file  Occupational History   Occupation: customer service    Employer: [employer]  Tobacco Use   Smoking status: Former    Packs/day: 0.25    Years: 0.50    Additional pack years: 0.00    Total pack years: 0.13    Types: Cigarettes   Smokeless tobacco: Never   Tobacco comments:    Only smoked when she was stressed in her 51's. No longer smokes. Married, lives with spouse  Vaping Use   Vaping Use: Never used  Substance and Sexual Activity   Alcohol use: Yes    Alcohol/week: 7.0 standard drinks of alcohol    Types: 7 Glasses of wine per week    Comment: 1 glass at bedtime   Drug use: No   Sexual activity: Not on file  Other Topics Concern   Not on file  Social History Narrative   Not on  file   Social Determinants of Health   Financial Resource Strain: Not on file  Food Insecurity: Not on file  Transportation Needs: Not on file  Physical Activity: Not on file  Stress: Not on file  Social Connections: Not on file  Intimate Partner Violence: Not on file    No current facility-administered medications on file prior to encounter.   Current Outpatient Medications on File Prior to Encounter  Medication Sig Dispense Refill   metoprolol tartrate (LOPRESSOR) 50 MG tablet Take 50 mg by mouth daily.     Ubrogepant (UBRELVY PO) Take by mouth as needed.     acetaminophen (TYLENOL) 500 MG tablet Take 1,000 mg by mouth every 6 (six) hours as needed for moderate pain, fever or headache.     albuterol (VENTOLIN HFA) 108 (90 Base) MCG/ACT inhaler Inhale 2 puffs into the lungs every 6 (six) hours as needed for wheezing  or shortness of breath. 18 g 1   amLODipine (NORVASC) 5 MG tablet Take 1 tablet (5 mg total) by mouth every other day. 45 tablet 1   Blood Pressure Monitor DEVI Use to check blood pressures daily for hypertension and monitoring vitals at home due to COVID 1 Device 0   cetirizine (ZYRTEC) 10 MG tablet Take 10 mg by mouth daily.     ELDERBERRY PO Take 1 Dose by mouth daily.     linaclotide (LINZESS) 290 MCG CAPS capsule Linzess 290 mcg capsule     Multiple Vitamin (MULTIVITAMIN) tablet Take 1 tablet by mouth daily.       Phenylephrine-DM-GG-APAP (MUCINEX FAST-MAX COLD FLU PO) Take 10 mLs by mouth as needed (cold and flu). Liquid  Uses only when sick.      Allergies  Allergen Reactions   Peach [Prunus Persica] Itching and Rash   Penicillins Itching and Rash    Vitals:   08/02/22 1523  Weight: 79.4 kg  Height: 5\' 6"  (1.676 m)    Lungs: clear to ascultation Cor:  RRR Abdomen:  soft, nontender, nondistended. Ex:  no cords, erythema Pelvic:   Vulva: no masses, no atrophy, no lesions Vagina: no tenderness, no erythema, no abnormal vaginal discharge, no vesicle(s) or ulcers, no cystocele, no rectocele Cervix: grossly normal, no discharge, no cervical motion tenderness Uterus: normal size, normal shape, midline, no uterine prolapse, mobile, non-tender Bladder/Urethra: normal meatus, no urethral discharge, no urethral mass, bladder non distended, Urethra well supported Adnexa/Parametria: no parametrial tenderness, no parametrial mass, no adnexal tenderness, no ovarian mass   A:  For TLH/Bilateral salpingectomies, cysto.    P: P: All risks, benefits and alternatives d/w patient and she desires to proceed.  Patient has undergone an ERAS protocol and will receive preop antibiotics and SCDs during the operation.   Pt to have extended recovery but will go home same day if eating, ambulating, voiding and pain control is good.  Daria Pastures

## 2022-08-10 ENCOUNTER — Ambulatory Visit (HOSPITAL_BASED_OUTPATIENT_CLINIC_OR_DEPARTMENT_OTHER)
Admission: RE | Admit: 2022-08-10 | Discharge: 2022-08-10 | Disposition: A | Discharge status: Home / Self Care | Payer: BC Managed Care – PPO | Source: Ambulatory Visit | Attending: Obstetrics and Gynecology | Admitting: Obstetrics and Gynecology

## 2022-08-10 ENCOUNTER — Other Ambulatory Visit: Payer: Self-pay

## 2022-08-10 ENCOUNTER — Encounter (HOSPITAL_BASED_OUTPATIENT_CLINIC_OR_DEPARTMENT_OTHER): Payer: Self-pay | Admitting: Obstetrics and Gynecology

## 2022-08-10 ENCOUNTER — Encounter (HOSPITAL_BASED_OUTPATIENT_CLINIC_OR_DEPARTMENT_OTHER)
Admission: RE | Disposition: A | Discharge status: Home / Self Care | Payer: Self-pay | Source: Ambulatory Visit | Attending: Obstetrics and Gynecology

## 2022-08-10 ENCOUNTER — Ambulatory Visit (HOSPITAL_BASED_OUTPATIENT_CLINIC_OR_DEPARTMENT_OTHER): Payer: BC Managed Care – PPO | Admitting: Anesthesiology

## 2022-08-10 DIAGNOSIS — N8003 Adenomyosis of the uterus: Secondary | ICD-10-CM | POA: Insufficient documentation

## 2022-08-10 DIAGNOSIS — Z9889 Other specified postprocedural states: Secondary | ICD-10-CM | POA: Diagnosis present

## 2022-08-10 DIAGNOSIS — Z87891 Personal history of nicotine dependence: Secondary | ICD-10-CM | POA: Diagnosis not present

## 2022-08-10 DIAGNOSIS — Z01818 Encounter for other preprocedural examination: Secondary | ICD-10-CM

## 2022-08-10 DIAGNOSIS — I10 Essential (primary) hypertension: Secondary | ICD-10-CM | POA: Insufficient documentation

## 2022-08-10 DIAGNOSIS — D251 Intramural leiomyoma of uterus: Secondary | ICD-10-CM | POA: Insufficient documentation

## 2022-08-10 DIAGNOSIS — F419 Anxiety disorder, unspecified: Secondary | ICD-10-CM | POA: Insufficient documentation

## 2022-08-10 DIAGNOSIS — Z79899 Other long term (current) drug therapy: Secondary | ICD-10-CM | POA: Diagnosis not present

## 2022-08-10 HISTORY — PX: CYSTOSCOPY: SHX5120

## 2022-08-10 HISTORY — DX: Leiomyoma of uterus, unspecified: D25.9

## 2022-08-10 HISTORY — DX: Presence of dental prosthetic device (complete) (partial): Z97.2

## 2022-08-10 HISTORY — DX: Essential (primary) hypertension: I10

## 2022-08-10 HISTORY — DX: Presence of spectacles and contact lenses: Z97.3

## 2022-08-10 HISTORY — PX: ROBOTIC ASSISTED TOTAL HYSTERECTOMY WITH BILATERAL SALPINGO OOPHERECTOMY: SHX6086

## 2022-08-10 HISTORY — DX: Constipation, unspecified: K59.00

## 2022-08-10 LAB — TYPE AND SCREEN
ABO/RH(D): O POS
Antibody Screen: NEGATIVE

## 2022-08-10 LAB — POCT PREGNANCY, URINE: Preg Test, Ur: NEGATIVE

## 2022-08-10 LAB — ABO/RH: ABO/RH(D): O POS

## 2022-08-10 SURGERY — HYSTERECTOMY, TOTAL, ROBOT-ASSISTED, LAPAROSCOPIC, WITH BILATERAL SALPINGO-OOPHORECTOMY
Anesthesia: General | Site: Bladder

## 2022-08-10 MED ORDER — METRONIDAZOLE 500 MG/100ML IV SOLN
INTRAVENOUS | Status: AC
  Filled 2022-08-10: qty 100

## 2022-08-10 MED ORDER — OXYCODONE HCL 5 MG PO TABS
ORAL_TABLET | ORAL | Status: AC
  Filled 2022-08-10: qty 1

## 2022-08-10 MED ORDER — POVIDONE-IODINE 10 % EX SWAB: 2.0000 | Freq: Once | CUTANEOUS | Status: DC

## 2022-08-10 MED ORDER — PROPOFOL 10 MG/ML IV BOLUS
INTRAVENOUS | Status: AC
  Filled 2022-08-10: qty 20

## 2022-08-10 MED ORDER — CELECOXIB 200 MG PO CAPS
ORAL_CAPSULE | ORAL | Status: AC
  Filled 2022-08-10: qty 2

## 2022-08-10 MED ORDER — DOCUSATE SODIUM 100 MG PO CAPS
100.0000 mg | ORAL_CAPSULE | Freq: Two times a day (BID) | ORAL | Status: DC
  Administered 2022-08-10: 100 mg via ORAL

## 2022-08-10 MED ORDER — OXYCODONE HCL 5 MG PO TABS
ORAL_TABLET | ORAL | Status: AC
  Filled 2022-08-10: qty 2

## 2022-08-10 MED ORDER — MENTHOL 3 MG MT LOZG: 1.0000 | LOZENGE | OROMUCOSAL | Status: DC | PRN

## 2022-08-10 MED ORDER — KETAMINE HCL 50 MG/5ML IJ SOSY
PREFILLED_SYRINGE | INTRAMUSCULAR | Status: AC
  Filled 2022-08-10: qty 5

## 2022-08-10 MED ORDER — 0.9 % SODIUM CHLORIDE (POUR BTL) OPTIME
TOPICAL | Status: DC | PRN
  Administered 2022-08-10: 500 mL

## 2022-08-10 MED ORDER — OXYCODONE HCL 5 MG/5ML PO SOLN: 5.0000 mg | Freq: Once | ORAL | Status: DC | PRN

## 2022-08-10 MED ORDER — PROMETHAZINE HCL 25 MG/ML IJ SOLN: 6.2500 mg | INTRAMUSCULAR | Status: DC | PRN

## 2022-08-10 MED ORDER — ACETAMINOPHEN 500 MG PO TABS
ORAL_TABLET | ORAL | Status: AC
  Filled 2022-08-10: qty 2

## 2022-08-10 MED ORDER — KETOROLAC TROMETHAMINE 15 MG/ML IJ SOLN: 15.0000 mg | INTRAMUSCULAR | Status: DC

## 2022-08-10 MED ORDER — SCOPOLAMINE 1 MG/3DAYS TD PT72
MEDICATED_PATCH | TRANSDERMAL | Status: AC
  Filled 2022-08-10: qty 1

## 2022-08-10 MED ORDER — PANTOPRAZOLE SODIUM 40 MG PO TBEC
40.0000 mg | DELAYED_RELEASE_TABLET | Freq: Every day | ORAL | Status: DC
  Administered 2022-08-10: 40 mg via ORAL

## 2022-08-10 MED ORDER — SOD CITRATE-CITRIC ACID 500-334 MG/5ML PO SOLN: 30.0000 mL | ORAL | Status: DC

## 2022-08-10 MED ORDER — DEXAMETHASONE SODIUM PHOSPHATE 10 MG/ML IJ SOLN
INTRAMUSCULAR | Status: AC
  Filled 2022-08-10: qty 1

## 2022-08-10 MED ORDER — CIPROFLOXACIN IN D5W 400 MG/200ML IV SOLN
400.0000 mg | INTRAVENOUS | Status: AC
  Administered 2022-08-10: 400 mg via INTRAVENOUS

## 2022-08-10 MED ORDER — METOPROLOL TARTRATE 25 MG PO TABS: 50.0000 mg | ORAL_TABLET | Freq: Every day | ORAL | Status: DC

## 2022-08-10 MED ORDER — METRONIDAZOLE 500 MG/100ML IV SOLN
500.0000 mg | INTRAVENOUS | Status: AC
  Administered 2022-08-10: 500 mg via INTRAVENOUS

## 2022-08-10 MED ORDER — GABAPENTIN 300 MG PO CAPS
ORAL_CAPSULE | ORAL | Status: AC
  Filled 2022-08-10: qty 1

## 2022-08-10 MED ORDER — DOCUSATE SODIUM 100 MG PO CAPS
ORAL_CAPSULE | ORAL | Status: AC
  Filled 2022-08-10: qty 1

## 2022-08-10 MED ORDER — ESTRADIOL 2 MG PO TABS: 2.0000 mg | ORAL_TABLET | Freq: Every day | ORAL | Status: DC

## 2022-08-10 MED ORDER — OXYCODONE HCL 5 MG PO TABS: 5.0000 mg | ORAL_TABLET | Freq: Once | ORAL | Status: DC | PRN

## 2022-08-10 MED ORDER — ACETAMINOPHEN 500 MG PO TABS: 1000.0000 mg | ORAL_TABLET | Freq: Once | ORAL | Status: DC

## 2022-08-10 MED ORDER — OXYCODONE-ACETAMINOPHEN 5-325 MG PO TABS: 1.0000 | ORAL_TABLET | ORAL | Status: DC | PRN

## 2022-08-10 MED ORDER — SCOPOLAMINE 1 MG/3DAYS TD PT72
1.0000 | MEDICATED_PATCH | TRANSDERMAL | Status: DC
  Administered 2022-08-10: 1.5 mg via TRANSDERMAL

## 2022-08-10 MED ORDER — ALBUTEROL SULFATE HFA 108 (90 BASE) MCG/ACT IN AERS: 2.0000 | INHALATION_SPRAY | Freq: Four times a day (QID) | RESPIRATORY_TRACT | Status: DC | PRN

## 2022-08-10 MED ORDER — ONDANSETRON HCL 4 MG PO TABS: 4.0000 mg | ORAL_TABLET | Freq: Four times a day (QID) | ORAL | Status: DC | PRN

## 2022-08-10 MED ORDER — LIDOCAINE 2% (20 MG/ML) 5 ML SYRINGE
INTRAMUSCULAR | Status: DC | PRN
  Administered 2022-08-10: 60 mg via INTRAVENOUS

## 2022-08-10 MED ORDER — AMLODIPINE BESYLATE 5 MG PO TABS
5.0000 mg | ORAL_TABLET | Freq: Every day | ORAL | Status: DC
  Administered 2022-08-10: 5 mg via ORAL
  Filled 2022-08-10: qty 1

## 2022-08-10 MED ORDER — METOPROLOL TARTRATE 25 MG PO TABS
50.0000 mg | ORAL_TABLET | Freq: Once | ORAL | Status: AC
  Administered 2022-08-10: 50 mg via ORAL

## 2022-08-10 MED ORDER — FENTANYL CITRATE (PF) 100 MCG/2ML IJ SOLN
INTRAMUSCULAR | Status: AC
  Filled 2022-08-10: qty 2

## 2022-08-10 MED ORDER — SUGAMMADEX SODIUM 200 MG/2ML IV SOLN
INTRAVENOUS | Status: DC | PRN
  Administered 2022-08-10: 190 mg via INTRAVENOUS

## 2022-08-10 MED ORDER — SODIUM CHLORIDE 0.9 % IV SOLN
INTRAVENOUS | Status: DC | PRN
  Administered 2022-08-10: 60 mL

## 2022-08-10 MED ORDER — SODIUM CHLORIDE 0.9 % IR SOLN
Status: DC | PRN
  Administered 2022-08-10 (×2): 1000 mL

## 2022-08-10 MED ORDER — GABAPENTIN 300 MG PO CAPS: 300.0000 mg | ORAL_CAPSULE | ORAL | Status: DC

## 2022-08-10 MED ORDER — CIPROFLOXACIN IN D5W 400 MG/200ML IV SOLN
INTRAVENOUS | Status: AC
  Filled 2022-08-10: qty 200

## 2022-08-10 MED ORDER — OXYCODONE-ACETAMINOPHEN 5-325 MG PO TABS: 1.0000 | ORAL_TABLET | ORAL | 0 refills | Status: AC | PRN

## 2022-08-10 MED ORDER — CELECOXIB 200 MG PO CAPS
400.0000 mg | ORAL_CAPSULE | ORAL | Status: AC
  Administered 2022-08-10: 400 mg via ORAL

## 2022-08-10 MED ORDER — FENTANYL CITRATE (PF) 100 MCG/2ML IJ SOLN
INTRAMUSCULAR | Status: DC | PRN
  Administered 2022-08-10: 50 ug via INTRAVENOUS
  Administered 2022-08-10: 100 ug via INTRAVENOUS

## 2022-08-10 MED ORDER — ACETAMINOPHEN 500 MG PO TABS: 1000.0000 mg | ORAL_TABLET | ORAL | Status: DC

## 2022-08-10 MED ORDER — KETAMINE HCL 10 MG/ML IJ SOLN
INTRAMUSCULAR | Status: DC | PRN
  Administered 2022-08-10: 20 mg via INTRAVENOUS

## 2022-08-10 MED ORDER — ONDANSETRON HCL 4 MG/2ML IJ SOLN: 4.0000 mg | Freq: Four times a day (QID) | INTRAMUSCULAR | Status: DC | PRN

## 2022-08-10 MED ORDER — OXYCODONE HCL 5 MG PO TABS
5.0000 mg | ORAL_TABLET | Freq: Once | ORAL | Status: AC
  Administered 2022-08-10: 10 mg via ORAL

## 2022-08-10 MED ORDER — LINACLOTIDE 145 MCG PO CAPS: 145.0000 ug | ORAL_CAPSULE | Freq: Every day | ORAL | Status: DC

## 2022-08-10 MED ORDER — MIDAZOLAM HCL 2 MG/2ML IJ SOLN
INTRAMUSCULAR | Status: AC
  Filled 2022-08-10: qty 2

## 2022-08-10 MED ORDER — ACETAMINOPHEN 500 MG PO TABS
1000.0000 mg | ORAL_TABLET | Freq: Four times a day (QID) | ORAL | Status: DC | PRN
  Administered 2022-08-10: 1000 mg via ORAL

## 2022-08-10 MED ORDER — ONDANSETRON HCL 4 MG/2ML IJ SOLN
INTRAMUSCULAR | Status: DC | PRN
  Administered 2022-08-10: 4 mg via INTRAVENOUS

## 2022-08-10 MED ORDER — WHITE PETROLATUM EX OINT
TOPICAL_OINTMENT | CUTANEOUS | Status: AC
  Filled 2022-08-10: qty 5

## 2022-08-10 MED ORDER — ONDANSETRON HCL 4 MG/2ML IJ SOLN
INTRAMUSCULAR | Status: AC
  Filled 2022-08-10: qty 2

## 2022-08-10 MED ORDER — LACTATED RINGERS IV SOLN: INTRAVENOUS | Status: DC

## 2022-08-10 MED ORDER — AMLODIPINE BESYLATE 5 MG PO TABS: 5.0000 mg | ORAL_TABLET | ORAL | Status: DC

## 2022-08-10 MED ORDER — MIDAZOLAM HCL 5 MG/5ML IJ SOLN
INTRAMUSCULAR | Status: DC | PRN
  Administered 2022-08-10: 2 mg via INTRAVENOUS

## 2022-08-10 MED ORDER — FENTANYL CITRATE (PF) 100 MCG/2ML IJ SOLN
25.0000 ug | INTRAMUSCULAR | Status: DC | PRN
  Administered 2022-08-10: 25 ug via INTRAVENOUS

## 2022-08-10 MED ORDER — PANTOPRAZOLE SODIUM 40 MG PO TBEC
DELAYED_RELEASE_TABLET | ORAL | Status: AC
  Filled 2022-08-10: qty 1

## 2022-08-10 MED ORDER — ROCURONIUM BROMIDE 10 MG/ML (PF) SYRINGE
PREFILLED_SYRINGE | INTRAVENOUS | Status: DC | PRN
  Administered 2022-08-10: 50 mg via INTRAVENOUS

## 2022-08-10 MED ORDER — PROPOFOL 10 MG/ML IV BOLUS
INTRAVENOUS | Status: DC | PRN
  Administered 2022-08-10: 200 mg via INTRAVENOUS

## 2022-08-10 MED ORDER — FENTANYL CITRATE (PF) 250 MCG/5ML IJ SOLN
INTRAMUSCULAR | Status: AC
  Filled 2022-08-10: qty 5

## 2022-08-10 MED ORDER — FLUORESCEIN SODIUM 10 % IV SOLN
INTRAVENOUS | Status: AC
  Filled 2022-08-10: qty 5

## 2022-08-10 MED ORDER — METOPROLOL TARTRATE 25 MG PO TABS
ORAL_TABLET | ORAL | Status: AC
  Filled 2022-08-10: qty 2

## 2022-08-10 MED ORDER — LORATADINE 10 MG PO TABS: 10.0000 mg | ORAL_TABLET | Freq: Every day | ORAL | Status: DC

## 2022-08-10 MED ORDER — DEXAMETHASONE SODIUM PHOSPHATE 10 MG/ML IJ SOLN
INTRAMUSCULAR | Status: DC | PRN
  Administered 2022-08-10: 10 mg via INTRAVENOUS

## 2022-08-10 SURGICAL SUPPLY — 55 items
ADH SKN CLS APL DERMABOND .7 (GAUZE/BANDAGES/DRESSINGS) ×2
APL SRG 38 LTWT LNG FL B (MISCELLANEOUS)
APPLICATOR ARISTA FLEXITIP XL (MISCELLANEOUS) IMPLANT
COVER BACK TABLE 60X90IN (DRAPES) ×2 IMPLANT
COVER TIP SHEARS 8 DVNC (MISCELLANEOUS) ×2 IMPLANT
COVER TIP SHEARS 8MM DA VINCI (MISCELLANEOUS) ×2
DEFOGGER SCOPE WARMER CLEARIFY (MISCELLANEOUS) ×2 IMPLANT
DERMABOND ADVANCED .7 DNX12 (GAUZE/BANDAGES/DRESSINGS) ×2 IMPLANT
DRAPE ARM DVNC X/XI (DISPOSABLE) ×8 IMPLANT
DRAPE COLUMN DVNC XI (DISPOSABLE) ×2 IMPLANT
DRAPE DA VINCI XI ARM (DISPOSABLE) ×8
DRAPE DA VINCI XI COLUMN (DISPOSABLE) ×2
DRAPE ROBOTICS STRL (DRAPES) ×2 IMPLANT
DRAPE SURG IRRIG POUCH 19X23 (DRAPES) ×2 IMPLANT
DRAPE UTILITY XL STRL (DRAPES) ×2 IMPLANT
DURAPREP 26ML APPLICATOR (WOUND CARE) ×2 IMPLANT
ELECT REM PT RETURN 9FT ADLT (ELECTROSURGICAL) ×2
ELECTRODE REM PT RTRN 9FT ADLT (ELECTROSURGICAL) ×2 IMPLANT
GAUZE 4X4 16PLY ~~LOC~~+RFID DBL (SPONGE) IMPLANT
GLOVE BIO SURGEON STRL SZ7 (GLOVE) ×6 IMPLANT
HEMOSTAT ARISTA ABSORB 3G PWDR (HEMOSTASIS) IMPLANT
HOLDER FOLEY CATH W/STRAP (MISCELLANEOUS) IMPLANT
IRRIG SUCT STRYKERFLOW 2 WTIP (MISCELLANEOUS) ×2
IRRIGATION SUCT STRKRFLW 2 WTP (MISCELLANEOUS) ×2 IMPLANT
IV NS 1000ML (IV SOLUTION) ×4
IV NS 1000ML BAXH (IV SOLUTION) IMPLANT
KIT PINK PAD W/HEAD ARE REST (MISCELLANEOUS) ×2
KIT PINK PAD W/HEAD ARM REST (MISCELLANEOUS) ×2 IMPLANT
KIT TURNOVER CYSTO (KITS) ×2 IMPLANT
LEGGING LITHOTOMY PAIR STRL (DRAPES) ×2 IMPLANT
MANIPULATOR ADVINCU DEL 3.0 PL (MISCELLANEOUS) IMPLANT
NDL INSUFFLATION 14GA 120MM (NEEDLE) ×2 IMPLANT
NEEDLE INSUFFLATION 14GA 120MM (NEEDLE) ×2 IMPLANT
NS IRRIG 500ML POUR BTL (IV SOLUTION) IMPLANT
OBTURATOR OPTICAL STANDARD 8MM (TROCAR) ×2
OBTURATOR OPTICAL STND 8 DVNC (TROCAR) ×2
OBTURATOR OPTICALSTD 8 DVNC (TROCAR) ×2 IMPLANT
PACK ROBOT WH (CUSTOM PROCEDURE TRAY) ×2 IMPLANT
PACK ROBOTIC GOWN (GOWN DISPOSABLE) ×2 IMPLANT
PAD OB MATERNITY 4.3X12.25 (PERSONAL CARE ITEMS) ×2 IMPLANT
PAD PREP 24X48 CUFFED NSTRL (MISCELLANEOUS) ×2 IMPLANT
POUCH LAPAROSCOPIC INSTRUMENT (MISCELLANEOUS) IMPLANT
SEAL UNIV 5-12 XI (MISCELLANEOUS) ×6 IMPLANT
SEAL XI UNIVERSAL 5-12 (MISCELLANEOUS) ×6
SET IRRIG Y TYPE TUR BLADDER L (SET/KITS/TRAYS/PACK) ×2 IMPLANT
SET TRI-LUMEN FLTR TB AIRSEAL (TUBING) ×2 IMPLANT
SLEEVE SCD COMPRESS KNEE MED (STOCKING) ×2 IMPLANT
SPIKE FLUID TRANSFER (MISCELLANEOUS) ×2 IMPLANT
SUT VIC AB 0 CT1 36 (SUTURE) ×2 IMPLANT
SUT VICRYL RAPIDE 3 0 (SUTURE) ×4 IMPLANT
SUT VLOC 180 0 9IN  GS21 (SUTURE) ×2
SUT VLOC 180 0 9IN GS21 (SUTURE) ×2 IMPLANT
TOWEL OR 17X24 6PK STRL BLUE (TOWEL DISPOSABLE) ×2 IMPLANT
TRAY FOLEY W/BAG SLVR 14FR LF (SET/KITS/TRAYS/PACK) ×2 IMPLANT
TROCAR PORT AIRSEAL 5X120 (TROCAR) ×2 IMPLANT

## 2022-08-10 NOTE — Op Note (Signed)
Complications:  None.  Findings:  12 weeks size uterus.  Ovaries were normal.  The ureters were identified during multiple points of the case and were always out of the field of dissection.  On cystoscopy, the bladder was intact and bilateral spill was seen from each ureteral oriface.     Technique:   After adequate anesthesia was achieved the patient was positioned, prepped and draped in usual sterile fashion.  A speculum was placed in the vagina and the cervix dilated with pratt dilators.  The 3 cm Koh ring Advincula was assembled and placed in proper fashion.  The  Speculum was removed and the bladder catheterized with a foley.     Attention was turned to the abdomen where a 1 cm incision was made 1 cm above the umbilicus.  The veress needle was introduced without aspiration of bowel contents or blood and the abdomen insufflated. The 8.5 mm Robotic trocar was placed and the other three trocar sites were marked out, all approximately 10 cm from each other and the camera.  Two 8.5 mm trocars were placed on either side of the camera port and a 5 mm assistant port was placed 3 cm above the line of the other trocars.  All trocars were inserted under direct visualization of the camera.  The patient was placed in trendelenburg and then the Robot docked.  The fenestrated bipolar were placed on arm 1 and the Hot shears on arm 3 and introduced under direct visualization of the camera.   I then broke scrub and sat down at the console.  The above findings were noted and the ureters identified well out of the field of dissection.  The right fallopian tube was isolated and cauterized with the bipolar.  The Utero-ovarian ligament was then divided with the bipolar cautery and shears.  The posterior broad ligament was then divided with the hot shears until the uterosacral ligament.  The Broad and cardinal ligaments were then cauterized against the cervix to the level of the Koh ring, securing the uterine artery.  Each  pedicle was then incised with the shears.  The anterior leaf was then incised at the reflection of the vessico-uterine junction and the lateral bladder retracted inferiorly after the round ligament had been divided with the bipolar forceps.  The left tube was cauterized with the bipolar and divided with the shears;  then the left utero-ovarian ligament divided with the bipolar forceps and the scissors.  The round ligament was divided as well and the posterior leaf of the broad ligament then divided with the hot shears. The broad and cardinal ligaments were then cauterized on the left in the same way.   At the level of the internal os, the uterine arteries were bilaterally cauterized with the bipolar.  The ureters were identified well out of the field of dissection.     The bladder was then able to be retracted inferiorly and the vesico-uterine fascia was incised in the midline until the bladder was removed one cm below the Koh ring.  The hot shears then circumferentially incised the vagina at the level of the reflection on the [facility] ring.  Once the uterus and cervix were amputated, cautery was used to insure hemostasis of the cuff.  Once hemostasis was achieved, the scissors were changed to the mega suture cut needle driver and the cuff was closed with a running stitches of 0-vicryl V loc.  Cautery was used to ensure hemostasis of the left pedicles very superficially. The ureters  were peristalsing bilaterally well and very lateral to the areas of operation.     The Robot was then undocked and I scrubbed back in.  The needle was removed and Ropivicaine was introduced into the pelvis. The skin incisions were closed with subcuticular stitches of 3-0 vicryl Rapide and Dermabond.  All instruments were removed from the vagina and cystoscopy performed, revealing an intact bladder and vigourous spill of urine from each ureteral orifice.  The cystoscope was removed and the patient taken to the recovery room in stable  condition.   Lasondra Hodgkins A

## 2022-08-10 NOTE — Transfer of Care (Signed)
Immediate Anesthesia Transfer of Care Note  Patient: Joyce [name]  Procedure(s) Performed: XI ROBOTIC ASSISTED TOTAL HYSTERECTOMY WITH BILATERAL SALPINGECTOMY (Bilateral: Abdomen) CYSTOSCOPY (Bladder)  Patient Location: PACU  Anesthesia Type:General  Level of Consciousness: drowsy  Airway & Oxygen Therapy: Patient Spontanous Breathing and Patient connected to nasal cannula oxygen  Post-op Assessment: Report given to RN  Post vital signs: Reviewed and stable  Last Vitals:  Vitals Value Taken Time  BP 114/81 08/10/22 1145  Temp    Pulse 67 08/10/22 1147  Resp 8 08/10/22 1147  SpO2 100 % 08/10/22 1147  Vitals shown include unvalidated device data.  Last Pain:  Vitals:   08/10/22 0919  TempSrc: Oral  PainSc: 7       Patients Stated Pain Goal: 7 (0000000 [id number])  Complications: No notable events documented.

## 2022-08-10 NOTE — Progress Notes (Signed)
There has been no change in the patients history, status or exam since the history and physical.  Vitals:   08/02/22 1523  Weight: 79.4 kg  Height: 5\' 6"  (1.676 m)    Results for orders placed or performed during the hospital encounter of 08/10/22 (from the past 72 hour(s))  Pregnancy, urine POC     Status: None   Collection Time: 08/10/22  8:51 AM  Result Value Ref Range   Preg Test, Ur NEGATIVE NEGATIVE    Comment:        THE SENSITIVITY OF THIS METHODOLOGY IS >24 mIU/mL     Daria Pastures

## 2022-08-10 NOTE — Anesthesia Procedure Notes (Signed)
Procedure Name: Intubation Date/Time: 08/10/2022 9:57 AM  Performed by: Bonney Aid, CRNAPre-anesthesia Checklist: Patient identified, Emergency Drugs available, Suction available and Patient being monitored Patient Re-evaluated:Patient Re-evaluated prior to induction Oxygen Delivery Method: Circle system utilized Preoxygenation: Pre-oxygenation with 100% oxygen Induction Type: IV induction Ventilation: Mask ventilation without difficulty Laryngoscope Size: Mac and 3 Grade View: Grade I Tube type: Oral Number of attempts: 1 Airway Equipment and Method: Stylet Placement Confirmation: ETT inserted through vocal cords under direct vision, positive ETCO2 and breath sounds checked- equal and bilateral Secured at: 21 cm Tube secured with: Tape Dental Injury: Teeth and Oropharynx as per pre-operative assessment

## 2022-08-10 NOTE — Anesthesia Preprocedure Evaluation (Addendum)
Anesthesia Evaluation  Patient identified by MRN, date of birth, ID band Patient awake    Reviewed: Allergy & Precautions, NPO status , Patient's Chart, lab work & pertinent test results, reviewed documented beta blocker date and time   History of Anesthesia Complications Negative for: history of anesthetic complications  Airway Mallampati: III  TM Distance: >3 FB Neck ROM: Full    Dental  (+) Dental Advisory Given, Partial Upper   Pulmonary former smoker   Pulmonary exam normal        Cardiovascular hypertension, Pt. on medications and Pt. on home beta blockers Normal cardiovascular exam     Neuro/Psych  Headaches PSYCHIATRIC DISORDERS Anxiety        GI/Hepatic negative GI ROS, Neg liver ROS,,,  Endo/Other  negative endocrine ROS    Renal/GU negative Renal ROS     Musculoskeletal negative musculoskeletal ROS (+)    Abdominal   Peds  Hematology  (+) Blood dyscrasia, anemia   Anesthesia Other Findings HSV  Reproductive/Obstetrics  s/p tubal ligation                              Anesthesia Physical Anesthesia Plan  ASA: 2  Anesthesia Plan: General   Post-op Pain Management: Tylenol PO (pre-op)* and Toradol IV (intra-op)*   Induction: Intravenous  PONV Risk Score and Plan: 3 and Treatment may vary due to age or medical condition, Ondansetron, Dexamethasone, Midazolam and Scopolamine patch - Pre-op  Airway Management Planned: Oral ETT  Additional Equipment: None  Intra-op Plan:   Post-operative Plan: Extubation in OR  Informed Consent: I have reviewed the patients History and Physical, chart, labs and discussed the procedure including the risks, benefits and alternatives for the proposed anesthesia with the patient or authorized representative who has indicated his/her understanding and acceptance.     Dental advisory given  Plan Discussed with: CRNA and  Anesthesiologist  Anesthesia Plan Comments:         Anesthesia Quick Evaluation

## 2022-08-10 NOTE — Brief Op Note (Signed)
08/10/2022  11:22 AM  PATIENT:  Joyce [name]  52 y.o. female  PRE-OPERATIVE DIAGNOSIS:  intramural leiomyoma of the uterus  POST-OPERATIVE DIAGNOSIS:  intramural leiomyoma of the uterus  PROCEDURE:  Procedure(s): XI ROBOTIC ASSISTED TOTAL HYSTERECTOMY WITH BILATERAL SALPINGECTOMY (Bilateral) CYSTOSCOPY (N/A)  SURGEON:  Surgeon(s) and Role:    * Bobbye Charleston, MD - Primary    * Rowland Lathe, MD - Assisting  ANESTHESIA:   general  EBL:  20 mL   DRAINS: Urinary Catheter (Foley)   LOCAL MEDICATIONS USED:  OTHER Ropivicaine, fluorocein  SPECIMEN:  Source of Specimen:  Uterus, cervix, bilateral tubes  DISPOSITION OF SPECIMEN:  PATHOLOGY  COUNTS:  YES  TOURNIQUET:  * No tourniquets in log *  DICTATION: .Note written in [place]:  extended recovery  PATIENT DISPOSITION:  PACU - hemodynamically stable.   Delay start of Pharmacological VTE agent (>24hrs) due to surgical blood loss or risk of bleeding: not applicable

## 2022-08-10 NOTE — Anesthesia Postprocedure Evaluation (Signed)
Anesthesia Post Note  Patient: Joyce [name]  Procedure(s) Performed: XI ROBOTIC ASSISTED TOTAL HYSTERECTOMY WITH BILATERAL SALPINGECTOMY (Bilateral: Abdomen) CYSTOSCOPY (Bladder)     Patient location during evaluation: PACU Anesthesia Type: General Level of consciousness: awake and alert Pain management: pain level controlled Vital Signs Assessment: post-procedure vital signs reviewed and stable Respiratory status: spontaneous breathing, nonlabored ventilation and respiratory function stable Cardiovascular status: stable and blood pressure returned to baseline Anesthetic complications: no   No notable events documented.  Last Vitals:  Vitals:   08/10/22 1245 08/10/22 1315  BP: (!) 137/91 (!) 144/90  Pulse: 65 66  Resp: 12 18  Temp:  36.8 C  SpO2: 97% 100%                    Audry Pili

## 2022-08-10 NOTE — Discharge Instructions (Signed)
NEXT DOSE IBUPROFEN AFTER 9 PM TONIGHT IF NEEDED

## 2022-08-11 ENCOUNTER — Encounter (HOSPITAL_BASED_OUTPATIENT_CLINIC_OR_DEPARTMENT_OTHER): Payer: Self-pay | Admitting: Obstetrics and Gynecology

## 2022-08-11 LAB — SURGICAL PATHOLOGY

## 2022-10-11 ENCOUNTER — Ambulatory Visit: Payer: BC Managed Care – PPO | Admitting: Physician Assistant

## 2022-10-11 ENCOUNTER — Encounter: Payer: Self-pay | Admitting: Physician Assistant

## 2022-10-11 DIAGNOSIS — I10 Essential (primary) hypertension: Secondary | ICD-10-CM | POA: Diagnosis not present

## 2022-10-11 MED ORDER — AMLODIPINE BESYLATE 2.5 MG PO TABS
2.5000 mg | ORAL_TABLET | ORAL | 1 refills | Status: AC
Start: 2022-10-11 — End: ?

## 2022-10-11 MED ORDER — METOPROLOL TARTRATE 50 MG PO TABS: 50.0000 mg | ORAL_TABLET | Freq: Every day | ORAL | 1 refills | Status: AC

## 2022-10-11 NOTE — Progress Notes (Unsigned)
New Patient Office Visit  Subjective    Patient ID: Joyce [name], female    DOB: May 25, 1970  Age: 52 y.o. MRN: [id number]  CC:  Chief Complaint  Patient presents with   Medication Refill    Metoprolol ER 500 mg, amlodipine 2.5 ml     HPI Joyce [name] states that her primary care provider left the practice and she is in the process of looking for a new primary care provider.  States that she needs a refill of her blood pressure medications.  States that she does check her blood pressure at home, states that it is generally within normal limits.  No other concerns at this time  Outpatient Encounter Medications as of 10/11/2022  Medication Sig   acetaminophen (TYLENOL) 500 MG tablet Take 1,000 mg by mouth every 6 (six) hours as needed for moderate pain, fever or headache.   albuterol (VENTOLIN HFA) 108 (90 Base) MCG/ACT inhaler Inhale 2 puffs into the lungs every 6 (six) hours as needed for wheezing or shortness of breath.   Blood Pressure Monitor DEVI Use to check blood pressures daily for hypertension and monitoring vitals at home due to COVID   cetirizine (ZYRTEC) 10 MG tablet Take 10 mg by mouth daily.   ELDERBERRY PO Take 1 Dose by mouth daily.   estradiol (ESTRACE) 2 MG tablet Take 2 mg by mouth daily. Unsure about dosage   linaclotide (LINZESS) 290 MCG CAPS capsule Linzess 290 mcg capsule   Multiple Vitamin (MULTIVITAMIN) tablet Take 1 tablet by mouth daily.     Ubrogepant (UBRELVY PO) Take by mouth as needed.   [DISCONTINUED] amLODipine (NORVASC) 5 MG tablet Take 1 tablet (5 mg total) by mouth every other day.   [DISCONTINUED] metoprolol tartrate (LOPRESSOR) 50 MG tablet Take 50 mg by mouth daily.   amLODipine (NORVASC) 2.5 MG tablet Take 1 tablet (2.5 mg total) by mouth every other day.   metoprolol tartrate (LOPRESSOR) 50 MG tablet Take 1 tablet (50 mg total) by mouth daily.   oxyCODONE-acetaminophen (PERCOCET/ROXICET) 5-325 MG tablet Take 1 tablet by mouth every 4 (four)  hours as needed for severe pain. (Patient not taking: Reported on 10/11/2022)   Phenylephrine-DM-GG-APAP (MUCINEX FAST-MAX COLD FLU PO) Take 10 mLs by mouth as needed (cold and flu). Liquid  Uses only when sick. (Patient not taking: Reported on 10/11/2022)   No facility-administered encounter medications on file as of 10/11/2022.    Past Medical History:  Diagnosis Date   Anxiety state 03/15/2014   resolved per pt as of 08/02/2022   Constipation    takes Linzess prn   COVID-19 03/2019   pneumonia, no hospitalization   Fibroid, uterine    GENITAL HERPES    Headache(784.0)    migraines, takes Ubrelvy prn   HTN (hypertension)    Follows w/ Lindaann Pascal, PA @ [facility].   IBS    Palpitations 03/15/2014   Follows w/ Lindaann Pascal, PA. Taking metoprolol as of 08/02/22.   Wears contact lenses    Wears dentures    upper plate   Wears glasses     Past Surgical History:  Procedure Laterality Date   BREAST LUMPECTOMY Left    COLONOSCOPY WITH ESOPHAGOGASTRODUODENOSCOPY (EGD)  2011   CYSTOSCOPY N/A 08/10/2022   Procedure: CYSTOSCOPY;  Surgeon: Carrington Clamp, MD;  Location: [facility];  Service: Gynecology;  Laterality: N/A;   ROBOTIC ASSISTED TOTAL HYSTERECTOMY WITH BILATERAL SALPINGO OOPHERECTOMY Bilateral 08/10/2022   Procedure: XI ROBOTIC ASSISTED  TOTAL HYSTERECTOMY WITH BILATERAL SALPINGECTOMY;  Surgeon: Carrington Clamp, MD;  Location: [facility];  Service: Gynecology;  Laterality: Bilateral;   TUBAL LIGATION  05/16/2001    Family History  Problem Relation Age of Onset   Hypertension Mother    Hypertension Father    Irritable bowel syndrome Father    Hypertension Sister     Social History   Socioeconomic History   Marital status: Married    Spouse name: Not on file   Number of children: 3   Years of education: Not on file   Highest education level: Not on file  Occupational History   Occupation: customer service    Employer:  [employer]  Tobacco Use   Smoking status: Former    Packs/day: 0.25    Years: 0.50    Additional pack years: 0.00    Total pack years: 0.13    Types: Cigarettes    Passive exposure: Never   Smokeless tobacco: Never   Tobacco comments:    Only smoked when she was stressed in her 53's. No longer smokes. Married, lives with spouse  Vaping Use   Vaping Use: Never used  Substance and Sexual Activity   Alcohol use: Yes    Alcohol/week: 7.0 standard drinks of alcohol    Types: 7 Glasses of wine per week    Comment: 1 glass at bedtime   Drug use: No   Sexual activity: Not on file  Other Topics Concern   Not on file  Social History Narrative   Not on file   Social Determinants of Health   Financial Resource Strain: Not on file  Food Insecurity: Not on file  Transportation Needs: Not on file  Physical Activity: Not on file  Stress: Not on file  Social Connections: Not on file  Intimate Partner Violence: Not on file    Review of Systems  Constitutional: Negative.   HENT: Negative.    Respiratory:  Negative for shortness of breath.   Cardiovascular:  Negative for chest pain.  Gastrointestinal: Negative.   Genitourinary: Negative.   Musculoskeletal: Negative.   Skin: Negative.   Neurological: Negative.   Endo/Heme/Allergies: Negative.   Psychiatric/Behavioral: Negative.          Objective    BP 138/82 (BP Location: Left Arm, Patient Position: Sitting, Cuff Size: Large)   Pulse 72   Ht 5\' 6"  (1.676 m)   Wt 172 lb (78 kg)   LMP  (LMP Unknown) Comment: irregular, on and off bleeding  SpO2 99%   BMI 27.76 kg/m   Physical Exam Vitals and nursing note reviewed.  Constitutional:      Appearance: Normal appearance.  HENT:     Head: Normocephalic and atraumatic.     Right Ear: External ear normal.     Left Ear: External ear normal.     Nose: Nose normal.     Mouth/Throat:     Mouth: Mucous membranes are moist.     Pharynx: Oropharynx is clear.  Eyes:      Extraocular Movements: Extraocular movements intact.     Conjunctiva/sclera: Conjunctivae normal.     Pupils: Pupils are equal, round, and reactive to light.  Cardiovascular:     Rate and Rhythm: Normal rate and regular rhythm.     Pulses: Normal pulses.     Heart sounds: Normal heart sounds.  Pulmonary:     Effort: Pulmonary effort is normal.     Breath sounds: Normal breath sounds.  Musculoskeletal:  General: Normal range of motion.     Cervical back: Normal range of motion and neck supple.  Skin:    General: Skin is warm and dry.  Neurological:     General: No focal deficit present.     Mental Status: She is alert and oriented to person, place, and time.  Psychiatric:        Mood and Affect: Mood normal.        Behavior: Behavior normal.        Thought Content: Thought content normal.        Judgment: Judgment normal.       Assessment & Plan:   Problem List Items Addressed This Visit   None Visit Diagnoses     Essential hypertension       Relevant Medications   metoprolol tartrate (LOPRESSOR) 50 MG tablet   amLODipine (NORVASC) 2.5 MG tablet      1. Essential hypertension Continue current regimen.  Patient education given on supportive care.  Patient encouraged to use MyChart to schedule appointment for new primary care provider or reach out to her health insurance.  Patient understands and agrees.  Patient encouraged to return the mobile unit as needed.  Red flags given for prompt reevaluation - metoprolol tartrate (LOPRESSOR) 50 MG tablet; Take 1 tablet (50 mg total) by mouth daily.  Dispense: 30 tablet; Refill: 1 - amLODipine (NORVASC) 2.5 MG tablet; Take 1 tablet (2.5 mg total) by mouth every other day.  Dispense: 30 tablet; Refill: 1   I have reviewed the patient's medical history (PMH, PSH, Social History, Family History, Medications, and allergies) , and have been updated if relevant. I spent 20 minutes reviewing chart and  face to face time with  patient.    Return if symptoms worsen or fail to improve.   Kasandra Knudsen Mayers, PA-C

## 2022-10-11 NOTE — Patient Instructions (Signed)
Please let us know if there is anything else we can do for you.  Please feel free to return the mobile unit as needed  Roney Jaffe, PA-C Physician Assistant [facility] [email or web address]   Health Maintenance, Female Adopting a healthy lifestyle and getting preventive care are important in promoting health and wellness. Ask your health care provider about: The right schedule for you to have regular tests and exams. Things you can do on your own to prevent diseases and keep yourself healthy. What should I know about diet, weight, and exercise? Eat a healthy diet  Eat a diet that includes plenty of vegetables, fruits, low-fat dairy products, and lean protein. Do not eat a lot of foods that are high in solid fats, added sugars, or sodium. Maintain a healthy weight Body mass index (BMI) is used to identify weight problems. It estimates body fat based on height and weight. Your health care provider can help determine your BMI and help you achieve or maintain a healthy weight. Get regular exercise Get regular exercise. This is one of the most important things you can do for your health. Most adults should: Exercise for at least 150 minutes each week. The exercise should increase your heart rate and make you sweat (moderate-intensity exercise). Do strengthening exercises at least twice a week. This is in addition to the moderate-intensity exercise. Spend less time sitting. Even light physical activity can be beneficial. Watch cholesterol and blood lipids Have your blood tested for lipids and cholesterol at 50 years of age, then have this test every 5 years. Have your cholesterol levels checked more often if: Your lipid or cholesterol levels are high. You are older than 50 years of age. You are at high risk for heart disease. What should I know about cancer screening? Depending on your health history and family history, you may need to  have cancer screening at various ages. This may include screening for: Breast cancer. Cervical cancer. Colorectal cancer. Skin cancer. Lung cancer. What should I know about heart disease, diabetes, and high blood pressure? Blood pressure and heart disease High blood pressure causes heart disease and increases the risk of stroke. This is more likely to develop in people who have high blood pressure readings or are overweight. Have your blood pressure checked: Every 3-5 years if you are 70-60 years of age. Every year if you are 85 years old or older. Diabetes Have regular diabetes screenings. This checks your fasting blood sugar level. Have the screening done: Once every three years after age 78 if you are at a normal weight and have a low risk for diabetes. More often and at a younger age if you are overweight or have a high risk for diabetes. What should I know about preventing infection? Hepatitis B If you have a higher risk for hepatitis B, you should be screened for this virus. Talk with your health care provider to find out if you are at risk for hepatitis B infection. Hepatitis C Testing is recommended for: Everyone born from [birth year] through 1963. Anyone with known risk factors for hepatitis C. Sexually transmitted infections (STIs) Get screened for STIs, including gonorrhea and chlamydia, if: You are sexually active and are younger than 50 years of age. You are older than 50 years of age and your health care provider tells you that you are at risk for this type of infection. Your sexual activity has changed since you were last screened, and you are at increased risk for chlamydia  or gonorrhea. Ask your health care provider if you are at risk. Ask your health care provider about whether you are at high risk for HIV. Your health care provider may recommend a prescription medicine to help prevent HIV infection. If you choose to take medicine to prevent HIV, you should first get tested for  HIV. You should then be tested every 3 months for as long as you are taking the medicine. Pregnancy If you are about to stop having your period (premenopausal) and you may become pregnant, seek counseling before you get pregnant. Take 400 to 800 micrograms (mcg) of folic acid every day if you become pregnant. Ask for birth control (contraception) if you want to prevent pregnancy. Osteoporosis and menopause Osteoporosis is a disease in which the bones lose minerals and strength with aging. This can result in bone fractures. If you are 60 years old or older, or if you are at risk for osteoporosis and fractures, ask your health care provider if you should: Be screened for bone loss. Take a calcium or vitamin D supplement to lower your risk of fractures. Be given hormone replacement therapy (HRT) to treat symptoms of menopause. Follow these instructions at home: Alcohol use Do not drink alcohol if: Your health care provider tells you not to drink. You are pregnant, may be pregnant, or are planning to become pregnant. If you drink alcohol: Limit how much you have to: 0-1 drink a day. Know how much alcohol is in your drink. In the [place] one drink equals one 12 oz bottle of beer (355 mL), one 5 oz glass of wine (148 mL), or one 1 oz glass of hard liquor (44 mL). Lifestyle Do not use any products that contain nicotine or tobacco. These products include cigarettes, chewing tobacco, and vaping devices, such as e-cigarettes. If you need help quitting, ask your health care provider. Do not use street drugs. Do not share needles. Ask your health care provider for help if you need support or information about quitting drugs. General instructions Schedule regular health, dental, and eye exams. Stay current with your vaccines. Tell your health care provider if: You often feel depressed. You have ever been abused or do not feel safe at home. Summary Adopting a healthy lifestyle and getting preventive  care are important in promoting health and wellness. Follow your health care provider's instructions about healthy diet, exercising, and getting tested or screened for diseases. Follow your health care provider's instructions on monitoring your cholesterol and blood pressure. This information is not intended to replace advice given to you by your health care provider. Make sure you discuss any questions you have with your health care provider. Document Revised: 09/21/2020 Document Reviewed: 09/21/2020 Elsevier Patient Education  2024 [employer].

## 2022-10-12 ENCOUNTER — Encounter: Payer: Self-pay | Admitting: Physician Assistant

## 2022-10-12 DIAGNOSIS — I10 Essential (primary) hypertension: Secondary | ICD-10-CM | POA: Insufficient documentation
